# Patient Record
Sex: Male | Born: 1975 | Hispanic: No | Marital: Married | Smoking: Never smoker
Health system: Southern US, Community
[De-identification: ages and names within clinical notes are randomized; demographics above are authoritative.]

## PROBLEM LIST (undated history)

## (undated) DIAGNOSIS — K648 Other hemorrhoids: Secondary | ICD-10-CM

## (undated) DIAGNOSIS — E559 Vitamin D deficiency, unspecified: Secondary | ICD-10-CM

## (undated) DIAGNOSIS — E785 Hyperlipidemia, unspecified: Secondary | ICD-10-CM

## (undated) HISTORY — PX: NASAL SINUS SURGERY: SHX719

## (undated) HISTORY — DX: Other hemorrhoids: K64.8

## (undated) HISTORY — DX: Vitamin D deficiency, unspecified: E55.9

## (undated) HISTORY — DX: Hyperlipidemia, unspecified: E78.5

---

## 2016-03-27 ENCOUNTER — Ambulatory Visit (INDEPENDENT_AMBULATORY_CARE_PROVIDER_SITE_OTHER): Payer: Managed Care, Other (non HMO) | Admitting: Family Medicine

## 2016-03-27 ENCOUNTER — Encounter: Payer: Self-pay | Admitting: Family Medicine

## 2016-03-27 VITALS — BP 114/76 | HR 73 | Temp 97.7°F | Ht 70.0 in | Wt 188.8 lb

## 2016-03-27 DIAGNOSIS — M545 Low back pain: Secondary | ICD-10-CM

## 2016-03-27 DIAGNOSIS — E786 Lipoprotein deficiency: Secondary | ICD-10-CM | POA: Diagnosis not present

## 2016-03-27 DIAGNOSIS — K648 Other hemorrhoids: Secondary | ICD-10-CM | POA: Diagnosis not present

## 2016-03-27 DIAGNOSIS — K760 Fatty (change of) liver, not elsewhere classified: Secondary | ICD-10-CM | POA: Insufficient documentation

## 2016-03-27 DIAGNOSIS — E785 Hyperlipidemia, unspecified: Secondary | ICD-10-CM

## 2016-03-27 DIAGNOSIS — Z6827 Body mass index (BMI) 27.0-27.9, adult: Secondary | ICD-10-CM

## 2016-03-27 DIAGNOSIS — M722 Plantar fascial fibromatosis: Secondary | ICD-10-CM | POA: Diagnosis not present

## 2016-03-27 DIAGNOSIS — E559 Vitamin D deficiency, unspecified: Secondary | ICD-10-CM | POA: Diagnosis not present

## 2016-03-27 DIAGNOSIS — Z0000 Encounter for general adult medical examination without abnormal findings: Secondary | ICD-10-CM

## 2016-03-27 DIAGNOSIS — M76891 Other specified enthesopathies of right lower limb, excluding foot: Secondary | ICD-10-CM | POA: Diagnosis not present

## 2016-03-27 DIAGNOSIS — Z23 Encounter for immunization: Secondary | ICD-10-CM

## 2016-03-27 DIAGNOSIS — M5136 Other intervertebral disc degeneration, lumbar region: Secondary | ICD-10-CM

## 2016-03-27 DIAGNOSIS — Z13220 Encounter for screening for lipoid disorders: Secondary | ICD-10-CM

## 2016-03-27 LAB — CBC WITH DIFFERENTIAL/PLATELET
Basophils Absolute: 0 10*3/uL (ref 0.0–0.1)
Basophils Relative: 0.6 % (ref 0.0–3.0)
Eosinophils Absolute: 0.2 10*3/uL (ref 0.0–0.7)
Eosinophils Relative: 4.6 % (ref 0.0–5.0)
HCT: 44.1 % (ref 39.0–52.0)
Hemoglobin: 15.2 g/dL (ref 13.0–17.0)
Lymphocytes Relative: 34.9 % (ref 12.0–46.0)
Lymphs Abs: 1.6 10*3/uL (ref 0.7–4.0)
MCHC: 34.4 g/dL (ref 30.0–36.0)
MCV: 89.3 fl (ref 78.0–100.0)
Monocytes Absolute: 0.4 10*3/uL (ref 0.1–1.0)
Monocytes Relative: 9.6 % (ref 3.0–12.0)
Neutro Abs: 2.3 10*3/uL (ref 1.4–7.7)
Neutrophils Relative %: 50.3 % (ref 43.0–77.0)
Platelets: 225 10*3/uL (ref 150.0–400.0)
RBC: 4.94 Mil/uL (ref 4.22–5.81)
RDW: 13 % (ref 11.5–15.5)
WBC: 4.6 10*3/uL (ref 4.0–10.5)

## 2016-03-27 LAB — COMPREHENSIVE METABOLIC PANEL
ALT: 39 U/L (ref 0–53)
AST: 23 U/L (ref 0–37)
Albumin: 4.7 g/dL (ref 3.5–5.2)
Alkaline Phosphatase: 47 U/L (ref 39–117)
BUN: 12 mg/dL (ref 6–23)
CO2: 29 mEq/L (ref 19–32)
Calcium: 9.5 mg/dL (ref 8.4–10.5)
Chloride: 103 mEq/L (ref 96–112)
Creatinine, Ser: 0.9 mg/dL (ref 0.40–1.50)
GFR: 98.92 mL/min (ref >60.00)
Glucose, Bld: 94 mg/dL (ref 70–99)
Potassium: 4.3 mEq/L (ref 3.5–5.1)
Sodium: 138 mEq/L (ref 135–145)
Total Bilirubin: 0.7 mg/dL (ref 0.2–1.2)
Total Protein: 7.6 g/dL (ref 6.0–8.3)

## 2016-03-27 LAB — LIPID PANEL
Cholesterol: 203 mg/dL — ABNORMAL HIGH (ref 0–200)
HDL: 31 mg/dL — ABNORMAL LOW (ref >39.00)
LDL Cholesterol: 140 mg/dL — ABNORMAL HIGH (ref 0–99)
NonHDL: 172.44
Total CHOL/HDL Ratio: 7
Triglycerides: 162 mg/dL — ABNORMAL HIGH (ref 0.0–149.0)
VLDL: 32.4 mg/dL (ref 0.0–40.0)

## 2016-03-27 LAB — VITAMIN D 25 HYDROXY (VIT D DEFICIENCY, FRACTURES): VITD: 30.22 ng/mL (ref 30.00–100.00)

## 2016-03-27 NOTE — Progress Notes (Signed)
Pre visit review using our clinic review tool, if applicable. No additional management support is needed unless otherwise documented below in the visit note. 

## 2016-03-27 NOTE — Patient Instructions (Signed)
It was nice to meet you today.  Please try the provided exercises.   I am referring you to GI as we discussed.   I am referring you to PT as requested.

## 2016-03-27 NOTE — Progress Notes (Signed)
Subjective:    Thomas Cooley is a 41 y.o. male and is here for a comprehensive physical exam.   Please see subsequent visit note for further concerns today.   Chief Complaint  Patient presents with  . Establish Care    NP  . Annual Exam   Diet: Most vegetarian, also enjoys chicken.  Exercise: Runs daily, 1 mile daily.   Weight in (lb) to have BMI = 25: 173.9  No past surgical history on file.   Social History   Social History  . Marital status: Married   Social History Main Topics  . Smoking status: Never Smoker  . Smokeless tobacco: Never Used  . Alcohol use Yes     Comment: Social  . Drug use: No  . Sexual activity: Yes    Partners: Female    No family history on file.   Allergies  Allergen Reactions  . Floraquin [Iodoquinol] Rash   No outpatient prescriptions prior to visit.   Review of Systems  Constitutional: Negative for chills, fever, malaise/fatigue and weight loss.  HENT: Negative for nosebleeds and tinnitus.   Eyes: Negative for blurred vision and double vision.  Respiratory: Negative for cough and shortness of breath.   Cardiovascular: Negative for chest pain, palpitations, orthopnea and leg swelling.  Gastrointestinal: Positive for blood in stool. Negative for abdominal pain, constipation, diarrhea, heartburn, melena, nausea and vomiting.  Genitourinary: Negative for dysuria, flank pain, frequency, hematuria and urgency.  Musculoskeletal: Positive for joint pain and myalgias.  Skin: Negative for itching and rash.  Neurological: Negative for dizziness, focal weakness, weakness and headaches.  Endo/Heme/Allergies: Does not bruise/bleed easily.  Psychiatric/Behavioral: Negative for depression. The patient is not nervous/anxious and does not have insomnia.     Objective:   Vitals:   03/27/16 0751  BP: 114/76  Pulse: 73  Temp: 97.7 F (36.5 C)   Body mass index is 27.09 kg/m.  General Appearance:  Alert, cooperative, no distress,  appears stated age  Head:  Normocephalic, without obvious abnormality, atraumatic  Eyes:  PERRL, conjunctiva/corneas clear, EOM's intact, fundi benign, both eyes       Ears:  Normal TM's and external ear canals, both ears  Nose: Nares normal, septum midline, mucosa normal, no drainage    or sinus tenderness  Throat: Lips, mucosa, and tongue normal; teeth and gums normal  Neck: Supple, symmetrical, trachea midline, no adenopathy; thyroid:  No enlargement/tenderness/nodules; no carotit bruit or JVD  Back:   Symmetric, no curvature, ROM normal, no CVA tenderness  Lungs:   Clear to auscultation bilaterally, respirations unlabored  Chest wall:  No tenderness or deformity  Heart:  Regular rate and rhythm, S1 and S2 normal, no murmur, rub   or gallop  Abdomen:   Soft, non-tender, bowel sounds active all four quadrants, no masses, no organomegaly  Extremities: No cyanosis or edema  Prostate: Not done.   Skin: Skin color, texture, turgor normal, no rashes or lesions  Lymph nodes: Cervical, supraclavicular, and axillary nodes normal  Neurologic: CNII-XII grossly intact. Normal strength, sensation and reflexes throughout    Assessment/Plan:    Well Adult Exam: Labs ordered: Yes. Patient counseling was done. See below for items discussed. Discussed the patient's BMI.  The BMI is in the acceptable range Follow up next physical in 1 year or sooner for acute issues.   Patient Counseling: [x]   Nutrition: Stressed importance of moderation in sodium/caffeine intake, saturated fat and cholesterol, caloric balance, sufficient intake of fresh fruits, vegetables, and  fiber.  [x]   Stressed the importance of regular exercise.   []   Substance Abuse: Discussed cessation/primary prevention of tobacco, alcohol, or other drug use; driving or other dangerous activities under the influence; availability of treatment for abuse.   []   Sexuality: Discussed sexually transmitted diseases, partner selection, use of condoms,  avoidance of unintended pregnancy  and contraceptive alternatives. Injury prevention: Discussed safety belts, safety helmets, smoke detector, smoking near bedding or upholstery.   [x]   Dental health: Discussed importance of regular tooth brushing, flossing, and dental visits.  [x]   Health maintenance and immunizations reviewed. Please refer to Health maintenance section.    SPLIT BILLING NOTE See separate note regarding significant problems addressed in addition to the [facility] exam elements. See next page.

## 2016-03-27 NOTE — Progress Notes (Signed)
   Patient ID: Thomas KalataHarshendra Cooley, male  DOB: 04/15/1975  Age: 41 y.o. MRN: [id number]   History of Present Illness:    1. Plantar fasciitis of right foot. Heel. Worse with first step in the morning. Worsening over the past few months. Runs daily, about 1 mile. Cycles as well. No trauma. No treatment.    2. Tendonitis of knee, right > left. Posterior. Same time frame. Feels tight. Worse with extension. No treatment.   3. Bleeding internal hemorrhoids. Hx of the same. Colonoscopy a few years ago, Dx with internal hemorrhoids. Confirmed in [place]. Has been treating with herbs and watching diet. No D/C, melena. No other findings on colonoscopy per patient.     4. Vitamin D deficiency. Replaced with high dose previously. Wants checked again.    PMHx, SurgHx, SocialHx, Medications, and Allergies were reviewed in the Visit Navigator and updated as appropriate.  REVIEW OF SYSTEMS: Pertinent items noted in HPI and remainder of comprehensive ROS otherwise negative.  Physical Exam:   Vitals:   03/27/16 0751  BP: 114/76  Pulse: 73  Temp: 97.7 F (36.5 C)     Body mass index is 27.09 kg/m.  General: Alert, cooperative, appears stated age and no distress.  HEENT:  Normocephalic, without obvious abnormality, atraumatic. Conjunctivae/corneas clear. PERRL, EOM's intact. Normal TM's and external ear canals both ears. Nares normal. Septum midline. Mucosa normal. No drainage or sinus tenderness. Lips, mucosa, and tongue normal; teeth and gums normal.  Lungs: Clear to auscultation bilaterally.  Heart:: Regular rate and rhythm, S1, S2 normal, no murmur, click, rub or gallop.  Abdomen: Soft, non-tender; bowel sounds normal; no masses,  no organomegaly.  Extremities: Extremities normal, atraumatic, no cyanosis or edema.  Pulses: 2+ and symmetric.  Skin: Skin color, texture, turgor normal. No rashes or lesions.  Neurologic: Alert and oriented X 3, normal strength and tone. Normal symmetric. reflexes.  Normal coordination and gait.  Psych: Alert,oriented, in NAD with a full range of affect, normal behavior and no psychotic features  Msk:        Assessment and Plan:    Plantar fasciitis of right foot Comments: New. Reviewed diagnosis, treatment, and expectations. Handout provided. PT referral as below.   Tendonitis of knee, right Comments:              New. Reviewed diagnosis, treatment, and expectations. Handout provided. PT referral as below.  Orders: -     Ambulatory referral to Physical Therapy  Bleeding internal hemorrhoids Comments: Worsening. Hx of colonoscopy in [place]. Will request and review records. Referral to Gastroenterology. Reviewed red flags.  Orders: -     Ambulatory referral to Gastroenterology -     CBC with Differential/Platelet; Future -     Comprehensive metabolic panel; Future  Helane RimaErica Helios Kohlmann, D.O. Family Medicine [facility], [facility]

## 2016-03-28 MED ORDER — NIACIN ER 250 MG PO CPCR: 250.0000 mg | ORAL_CAPSULE | Freq: Every day | ORAL | 2 refills | Status: DC

## 2016-03-28 NOTE — Addendum Note (Signed)
Addended by: Helane RimaWALLACE, Shacarra Choe R on: 03/28/2016 03:39 PM   Modules accepted: Orders

## 2016-03-29 ENCOUNTER — Encounter: Payer: Self-pay | Admitting: Physical Therapy

## 2016-03-29 ENCOUNTER — Encounter: Payer: Self-pay | Admitting: Nurse Practitioner

## 2016-03-29 ENCOUNTER — Ambulatory Visit (INDEPENDENT_AMBULATORY_CARE_PROVIDER_SITE_OTHER): Payer: Managed Care, Other (non HMO) | Admitting: Nurse Practitioner

## 2016-03-29 ENCOUNTER — Ambulatory Visit: Payer: Managed Care, Other (non HMO) | Attending: Family Medicine | Admitting: Physical Therapy

## 2016-03-29 VITALS — BP 110/62 | HR 100 | Ht 69.29 in | Wt 193.2 lb

## 2016-03-29 DIAGNOSIS — M25561 Pain in right knee: Secondary | ICD-10-CM | POA: Insufficient documentation

## 2016-03-29 DIAGNOSIS — M6281 Muscle weakness (generalized): Secondary | ICD-10-CM | POA: Insufficient documentation

## 2016-03-29 DIAGNOSIS — M25571 Pain in right ankle and joints of right foot: Secondary | ICD-10-CM | POA: Diagnosis present

## 2016-03-29 DIAGNOSIS — K625 Hemorrhage of anus and rectum: Secondary | ICD-10-CM | POA: Diagnosis not present

## 2016-03-29 MED ORDER — HYDROCORTISONE 2.5 % RE CREA: 1.0000 "application " | TOPICAL_CREAM | Freq: Every day | RECTAL | 0 refills | Status: DC

## 2016-03-29 NOTE — Patient Instructions (Addendum)
PROM: Toe Flexion / Extension    Gently grasp right toes Do second position. Hold 30____ seconds. Repeat _3___ times per set. Do _1___ sets per session. Do 1___ sessions per day.   http://orth.exer.us/66   Copyright  VHI. All rights reserved.  Achilles / Soleus, Standing    Stand, right foot behind, heel on floor and turned slightly out. Lower hips and bend knees. Hold _30__ seconds. Repeat _3__ times per session. Do __1_ sessions per day.  Copyright  VHI. All rights reserved.  Achilles / Gastroc, Standing    Stand, right foot behind, heel on floor and turned slightly out, leg straight, forward leg bent. Move hips forward. Hold __30_ seconds. Repeat _3__ times per session. Do __1_ sessions per day.  Copyright  VHI. All rights reserved.    Foot Arch Stretch (Plantar Fascia)    Sitting on edge of chair, place foot on top of rolling pin and gently roll foot forward and backward over rolling pin. Feel stretch in arch of foot. Repeat with other foot. Repeat ____1-2 minutes using hard ball or rolling pin, or frozen water bottle.  Do __1__ sessions per day.  http://gt2.exer.us/415   Copyright  VHI. All rights reserved.  Chair Sitting    Sit at edge of seat, spine straight, one leg extended. Put a hand on each thigh and bend forward from the hip, keeping spine straight. Allow hand on extended leg to reach toward toes. Support upper body with other arm. Hold __30_ seconds. Repeat _3__ times per session. Do __1_ sessions per day.  Can also do lying on your back with strap  Copyright  VHI. All rights reserved.

## 2016-03-29 NOTE — Patient Instructions (Signed)
If you are age 41 or older, your body mass index should be between 23-30. Your Body mass index is 28.3 kg/m. If this is out of the aforementioned range listed, please consider follow up with your Primary Care Provider.  If you are age 41 or younger, your body mass index should be between 19-25. Your Body mass index is 28.3 kg/m. If this is out of the aformentioned range listed, please consider follow up with your Primary Care Provider.   We have sent the following medications to your pharmacy for you to pick up at your convenience:  Anusol Cream  You have been given a hand out on hemorrhoid banding. Please contact our office to schedule an appoint with Dr Lavon PaganiniNandigam if you wish to have procedure.  Thank you

## 2016-03-29 NOTE — Progress Notes (Signed)
HPI:  Patient is a 41 year old male referred by PCP Helane RimaErica Wallace, D.O., for evaluation of rectal bleeding. He has had intermittent painless rectal bleeding since 2014.  Blood is bright red, low volume. Episodes occur every few months and last about 3 days. In 2016 patient had [place] in [place] Apparently the prep was poor but found to have internal hemorrhoids.  The same year he what sounds like an anoscopy in [place] with same findings. He was offered banding but decided to treat himself with herbal medication instead. He has continued to successfully treat the hemorrhoids with herbal medication as needed. Patient has no abdominal pain. His stools are solid, sometimes hard.       Past Medical History:  Diagnosis Date  . Hyperlipidemia   . Internal hemorrhoid, bleeding   . Vitamin D deficiency     Past Surgical History:  Procedure Laterality Date  . NASAL SINUS SURGERY     Family History  Problem Relation Age of Onset  . Diabetes Mother   . Anal fissures Mother   . Diabetes Father   . Kidney failure Father   . Anal fissures Father    Social History  Substance Use Topics  . Smoking status: Never Smoker  . Smokeless tobacco: Never Used  . Alcohol use Yes     Comment: Social    Current Outpatient Prescriptions  Medication Sig Dispense Refill  . niacin 250 MG CR capsule Take 1 capsule (250 mg total) by mouth at bedtime. 30 capsule 2  . Vitamin D, Ergocalciferol, (DRISDOL) 50000 units CAPS capsule Take 50,000 Units by mouth every 7 (seven) days.     No current facility-administered medications for this visit.    Allergies  Allergen Reactions  . Floraquin [Iodoquinol] Rash    Review of Systems: Positive for headaches, itching, muscle pain . All other systems reviewed and negative except where noted in HPI.    Physical Exam: BP 110/62 (BP Location: Left Arm, Patient Position: Sitting, Cuff Size: Normal)   Pulse 100   Ht 5' 9.29" (1.76 m) Comment:  height measured without shoes  Wt 193 lb 4 oz (87.7 kg)   BMI 28.30 kg/m  Constitutional:  Well-developed, male in no acute distress. Psychiatric: Normal mood and affect. Behavior is normal. HEENT: Normocephalic and atraumatic. Conjunctivae are normal. No scleral icterus. Neck supple.  Cardiovascular: Normal rate, regular rhythm.  Pulmonary/chest: Effort normal and breath sounds normal. No wheezing, rales or rhonchi. Abdominal: Soft, nondistended, nontender. Bowel sounds active throughout. There are no masses palpable. No hepatomegaly. Rectal: no external lesions. No blood nor stool in vault. He was tense on exam but I didn't appreciate any rectal masses.  Extremities: no edema Lymphadenopathy: No cervical adenopathy noted. Neurological: Alert and oriented to person place and time. Skin: Skin is warm and dry. No rashes noted.   ASSESSMENT AND PLAN:   Pleasant 41 yo male with 3-4 year history of episodic painless rectal bleeding with bowel movements. Diagnosed with internal hemorrhoids in 2016. He takes an herbal medication as needed for the hemorrhoidal bleeding.  -We discussed treatment options. He is interested in trial of hydrocortisone cream. Patient takes an herbal medication as needed for hemorrhoids, he might choose to continue this instead.  -We discussed hemorrhoidal banding but he first needs a complete colonoscopy to rule out other etiologies of bleeding. It sounds like the colonoscopy in [place]was limited by poor prep. Records requested and I'll call him when received.  I gave  him banding brochure to read in the interim.    Willette Cluster, NP  03/29/2016, 2:24 PM  Cc: Helane Rima, DO

## 2016-03-30 ENCOUNTER — Ambulatory Visit: Payer: Managed Care, Other (non HMO) | Admitting: Physical Therapy

## 2016-03-30 DIAGNOSIS — M25571 Pain in right ankle and joints of right foot: Secondary | ICD-10-CM | POA: Diagnosis not present

## 2016-03-30 DIAGNOSIS — M6281 Muscle weakness (generalized): Secondary | ICD-10-CM

## 2016-03-30 DIAGNOSIS — M25561 Pain in right knee: Secondary | ICD-10-CM

## 2016-03-30 NOTE — Patient Instructions (Signed)
     Trigger Point Dry Needling  . What is Trigger Point Dry Needling (DN)? o DN is a physical therapy technique used to treat muscle pain and dysfunction. Specifically, DN helps deactivate muscle trigger points (muscle knots).  o A thin filiform needle is used to penetrate the skin and stimulate the underlying trigger point. The goal is for a local twitch response (LTR) to occur and for the trigger point to relax. No medication of any kind is injected during the procedure.   . What Does Trigger Point Dry Needling Feel Like?  o The procedure feels different for each individual patient. Some patients report that they do not actually feel the needle enter the skin and overall the process is not painful. Very mild bleeding may occur. However, many patients feel a deep cramping in the muscle in which the needle was inserted. This is the local twitch response.   . How Will I feel after the treatment? o Soreness is normal, and the onset of soreness may not occur for a few hours. Typically this soreness does not last longer than two days.  o Bruising is uncommon, however; ice can be used to decrease any possible bruising.  o In rare cases feeling tired or nauseous after the treatment is normal. In addition, your symptoms may get worse before they get better, this period will typically not last longer than 24 hours.   . What Can I do After My Treatment? o Increase your hydration by drinking more water for the next 24 hours. o You may place ice or heat on the areas treated that have become sore, however, do not use heat on inflamed or bruised areas. Heat often brings more relief post needling. o You can continue your regular activities, but vigorous activity is not recommended initially after the treatment for 24 hours. o DN is best combined with other physical therapy such as strengthening, stretching, and other therapies.    Stacy Simpson PT [facility] [address], [address] [place], [place] [address] Phone # [phone] Fax [phone] 

## 2016-03-30 NOTE — Therapy (Signed)
[facility] [address], [address] [place], [place], [address] Phone: [phone]   Fax:  [phone]  Physical Therapy Treatment  Patient Details  Name: Thomas Cooley MRN: [id number] Date of Birth: 02/23/1975 Referring Provider: Helane RimaErica Wallace, DO  Encounter Date: 03/30/2016      PT End of Session - 03/30/16 1716    Visit Number 2   Date for PT Re-Evaluation 05/24/16   PT Start Time 1530   PT Stop Time 1620   PT Time Calculation (min) 50 min   Activity Tolerance Patient tolerated treatment well      Past Medical History:  Diagnosis Date  . Hyperlipidemia   . Internal hemorrhoid, bleeding   . Vitamin D deficiency     Past Surgical History:  Procedure Laterality Date  . NASAL SINUS SURGERY      There were no vitals filed for this visit.      Subjective Assessment - 03/30/16 1533    Subjective (P)  No pain at present.  Last had morning pain in heel.  Knee pain with sitting with knee bent prolonged positions or criss cross positions.  Went to the gym yesterday and did fast walking on treadmill.     Currently in Pain? (P)  No/denies   Pain Score (P)  0-No pain                        [facility] Adult PT Treatment/Exercise - 03/30/16 0001      Knee/Hip Exercises: Stretches   Active Hamstring Stretch --  supine with strap and sitting   Gastroc Stretch --  standing   Soleus Stretch --  standing   Other Knee/Hip Stretches --      Doorway stretch with and without UE 3x 5 right and left.   Manual therapy Graston technique G4 to bilateral HS and gastroc/soleus  Moist heat bilaterally 10 min in prone.      Trigger Point Dry Needling - 03/30/16 1715    Consent Given? Yes   Education Handout Provided Yes   Muscles Treated Lower Body Hamstring;Gastrocnemius   Hamstring Response Palpable increased muscle length   Gastrocnemius Response Twitch response elicited;Palpable increased muscle length               PT Education - 03/30/16 0739    Education provided Yes   Education Details gastroc/soleus stretch, hamstring stretch, rolling on ball, plantar fascia stretch   Person(s) Educated Patient   Methods Explanation;Demonstration;Verbal cues;Handout   Comprehension Verbalized understanding;Returned demonstration          PT Short Term Goals - 03/30/16 1721      PT SHORT TERM GOAL #1   Title independent with initial HEP   Time 4   Period Weeks   Status On-going     PT SHORT TERM GOAL #2   Title reports able to sit on floor for 15 minutes before feeling increased pain   Time 4   Period Weeks   Status On-going           PT Long Term Goals - 03/30/16 1721      PT LONG TERM GOAL #1   Title FOTO < or = to 40%   Time 8   Period Weeks   Status On-going     PT LONG TERM GOAL #2   Title independent with advanced HEP   Time 8   Period Weeks   Status On-going     PT  LONG TERM GOAL #3   Title reports 75% reduced pain in the morning and when sitting on the floor   Period Weeks   Status On-going     PT LONG TERM GOAL #4   Title AROM ankle dorsiflexion equal bilaterally due to improved muslce length and to reduce pain during functional activities.   Time 8   Period Weeks   Status On-going               Plan - 03/30/16 1717    Clinical Impression Statement The patient reports no pain at present.  Has pain in popliteal fossa area with sitting on his knees or criss crossed.  He has bilateral heel pain especially in the mornings.  Tender points in gastrocs.  Reviewed HEP from yesterday and stressed the importance for best outcomes.  Therapist closely monitoring response with all interventions.     PT Next Visit Plan assess response to DN on right and continue if beneficial;  Graston technique to plantar fascia;  add hip strengthening to HEP      Patient will benefit from skilled therapeutic intervention in order to improve the following deficits and  impairments:     Visit Diagnosis: Pain in right ankle and joints of right foot  Acute pain of right knee  Muscle weakness (generalized)     Problem List Patient Active Problem List   Diagnosis Date Noted  . Plantar fasciitis of right foot 03/27/2016  . Vitamin D deficiency 03/27/2016  . Bleeding internal hemorrhoids 03/27/2016  . Tendonitis of knee, right 03/27/2016  . BMI 27.0-27.9,adult 03/27/2016  . 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 NAFL (nonalcoholic fatty liver) 03/27/2016    Lavinia Sharps, PT 03/30/16 5:24 PM Phone: [phone] Fax: [phone]  Vivien Presto 03/30/2016, Alfonse Flavors PM  [facility] [address] [place], [place], [address] Phone: [phone]   Fax:  [phone]  Name: Thomas Cooley MRN: [id number] Date of Birth: March 27, 1975

## 2016-03-30 NOTE — Therapy (Signed)
[facility] [address], [address] [place], [place], [address] Phone: [phone]   Fax:  [phone]  Physical Therapy Evaluation  Patient Details  Name: Thomas Cooley MRN: [id number] Date of Birth: [birth year] Referring Provider: Helane Rima, DO  Encounter Date: 03/29/2016      PT End of Session - 03/29/16 1155    Visit Number 1   Date for PT Re-Evaluation 05/24/16   PT Start Time 0845   PT Stop Time 0932   PT Time Calculation (min) 47 min   Activity Tolerance Patient tolerated treatment well   Behavior During Therapy [facility] for tasks assessed/performed      Past Medical History:  Diagnosis Date  . Hyperlipidemia   . Internal hemorrhoid, bleeding   . Vitamin D deficiency     Past Surgical History:  Procedure Laterality Date  . NASAL SINUS SURGERY      There were no vitals filed for this visit.       Subjective Assessment - 03/29/16 0859    Subjective Unable to sit on the floor as much as normal because knee starts hurting.  Reports started wearing new shoes about the time his heels began hurting.   Limitations Sitting   How long can you sit comfortably? 3-4 minutes   Currently in Pain? Yes   Pain Score 7    Pain Location Knee   Pain Orientation Left;Right;Mid  left>right   Pain Descriptors / Indicators Burning   Pain Onset More than a month ago   Pain Frequency Intermittent   Aggravating Factors  sitting on the floor   Pain Relieving Factors not sitting on the floor, takes 10-15 minutes to feel better   Effect of Pain on Daily Activities can't do things on the floor   Multiple Pain Sites Yes   Pain Score 8   Pain Location Heel   Pain Orientation Left;Right  Left > right   Pain Descriptors / Indicators Burning   Pain Type Acute pain   Pain Onset More than a month ago   Pain Frequency Intermittent   Aggravating Factors  walking on hard floor   Pain Relieving Factors walking for a little while     Effect of Pain on Daily Activities pain when walking in the morning            [facility] PT Assessment - 03/29/16 0001      Assessment   Medical Diagnosis M76.891 tendonitis of knee, right; Z23 encounter of immunization   Referring Provider Helane Rima, DO   Onset Date/Surgical Date 08/27/15  going on 7-8 month for knee, heel 3-4 months   Prior Therapy no     Precautions   Precautions None     Restrictions   Weight Bearing Restrictions No     Balance Screen   Has the patient fallen in the past 6 months No     Home Environment   Living Environment Private residence   Living Arrangements Spouse/significant other;Children   Home Layout One level     Prior Function   Level of Independence Independent   Vocation Full time employment   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   Leisure exercises in evenings - fast walking on treadmill and stationary bike at the gym     Cognition   Overall Cognitive Status Within Functional Limits for tasks assessed     Observation/Other Assessments   Focus on Therapeutic Outcomes (FOTO)  65% limited  40% limted - goal  Posture/Postural Control   Posture/Postural Control No significant limitations     AROM   Right Ankle Dorsiflexion 9   Left Ankle Dorsiflexion 15     Strength   Overall Strength Comments LE grossly 5/5 bilateral but Right slightly stronger than Left   Right Hip ABduction 4+/5   Left Hip ABduction 5/5     Palpation   Palpation comment Right > Left muscle spasms and trigger points along hamstrings, gastroc and soleus, point tenderness at bilateral plantar fascia and insertion on heels     Ambulation/Gait   Gait Pattern Within Functional Limits                   [facility] Adult PT Treatment/Exercise - 03/29/16 0001      Knee/Hip Exercises: Stretches   Active Hamstring Stretch 2 reps;30 seconds  supine with strap and sitting   Gastroc Stretch Right;2 reps;30 seconds  standing   Soleus Stretch  Right;2 reps;30 seconds  standing   Other Knee/Hip Stretches rolling ball on foot                PT Education - 03/30/16 0739    Education provided Yes   Education Details gastroc/soleus stretch, hamstring stretch, rolling on ball, plantar fascia stretch   Person(s) Educated Patient   Methods Explanation;Demonstration;Verbal cues;Handout   Comprehension Verbalized understanding;Returned demonstration          PT Short Term Goals - 03/30/16 0754      PT SHORT TERM GOAL #1   Title independent with initial HEP   Time 4   Period Weeks   Status New     PT SHORT TERM GOAL #2   Title reports able to sit on floor for 15 minutes before feeling increased pain   Time 4   Period Weeks   Status New           PT Long Term Goals - 03/30/16 0755      PT LONG TERM GOAL #1   Title FOTO < or = to 40%   Time 8   Period Weeks   Status New     PT LONG TERM GOAL #2   Title independent with advanced HEP   Time 8   Period Weeks   Status New     PT LONG TERM GOAL #3   Title reports 75% reduced pain in the morning and when sitting on the floor   Time 8   Period Weeks   Status New     PT LONG TERM GOAL #4   Title AROM ankle dorsiflexion equal bilaterally due to improved muslce length and to reduce pain during functional activities.   Time 8   Period Weeks   Status New               Plan - 03/30/16 [id number]    Clinical Impression Statement Pt presents for low complexity eval due to no comorbidities effecting treatment.  Pt presents with muscle spasms in hamstring and gastroc/solues Rt>Lt.  Mild weakness of Rt hip abduction and extension 4+/5.  Pt is has pain in both knees and heels up to 8/10 Rt> than Lt.  Pt presents with plantarfasciitis with tenderness at the heel that is worse in the morning.  Pt will benefit from skilled PT for manual techniques and dry needling to assess soft tissue and other PT treatments to address muscle imbalances and educate patient on  appriopriate HEP.   Rehab Potential Excellent   Clinical Impairments Affecting  Rehab Potential none   PT Frequency 2x / week   PT Duration 8 weeks   PT Treatment/Interventions Cryotherapy;Electrical Stimulation;Iontophoresis 4mg /ml Dexamethasone;Moist Heat;Dry needling;Manual techniques;Therapeutic activities;Therapeutic exercise;Neuromuscular re-education;ADLs/Self Care Home Management   PT Next Visit Plan assess knee mobility, dry needling as indicated, LE stretches and hip/glute strengthening   PT Home Exercise Plan progress as needed   Recommended Other Services none   Consulted and Agree with Plan of Care Patient      Patient will benefit from skilled therapeutic intervention in order to improve the following deficits and impairments:  Decreased range of motion, Increased muscle spasms, Pain, Decreased strength  Visit Diagnosis: Pain in right ankle and joints of right foot  Acute pain of right knee  Muscle weakness (generalized)     Problem List Patient Active Problem List   Diagnosis Date Noted  . Plantar fasciitis of right foot 03/27/2016  . Vitamin D deficiency 03/27/2016  . Bleeding internal hemorrhoids 03/27/2016  . Tendonitis of knee, right 03/27/2016  . BMI 27.0-27.9,adult 03/27/2016  . 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 NAFL (nonalcoholic fatty liver) 03/27/2016    Vincente Poli, PT 03/30/2016, 8:00 AM  [facility] [address] [place], [place], [address] Phone: [phone]   Fax:  [phone]  Name: Thomas Cooley MRN: [id number] Date of Birth: 02-24-1975

## 2016-04-03 NOTE — Progress Notes (Signed)
Reviewed and agree with documentation and assessment and plan. K. Veena Nandigam , MD   

## 2016-04-04 ENCOUNTER — Encounter: Payer: Managed Care, Other (non HMO) | Admitting: Physical Therapy

## 2016-04-07 ENCOUNTER — Ambulatory Visit: Payer: Managed Care, Other (non HMO) | Admitting: Physical Therapy

## 2016-04-07 DIAGNOSIS — M6281 Muscle weakness (generalized): Secondary | ICD-10-CM

## 2016-04-07 DIAGNOSIS — M25561 Pain in right knee: Secondary | ICD-10-CM

## 2016-04-07 DIAGNOSIS — M25571 Pain in right ankle and joints of right foot: Secondary | ICD-10-CM | POA: Diagnosis not present

## 2016-04-07 NOTE — Therapy (Signed)
[facility] [address], [address] [place], [place], [address] Phone: [phone]   Fax:  [phone]  Physical Therapy Treatment  Patient Details  Name: Thomas KalataHarshendra Theroux MRN: [id number] Date of Birth: Apr 26, 1975 Referring Provider: Helane RimaErica Wallace, DO  Encounter Date: 04/07/2016      PT End of Session - 04/07/16 0854    Visit Number 3   Date for PT Re-Evaluation 05/24/16   PT Start Time 0802   PT Stop Time 0843   PT Time Calculation (min) 41 min   Activity Tolerance Patient tolerated treatment well   Behavior [facility] Therapy [facility] for tasks assessed/performed      Past Medical History:  Diagnosis Date  . Hyperlipidemia   . Internal hemorrhoid, bleeding   . Vitamin D deficiency     Past Surgical History:  Procedure Laterality Date  . NASAL SINUS SURGERY      There were no vitals filed for this visit.      Subjective Assessment - 04/07/16 0806    Subjective States he did calf raises and his heel pain got worse.  Knee is still hurting when sitting criss cross after about 3 minutes, can't eat dinner   Patient Stated Goals eat dinner sitting on the floor   Currently in Pain? Yes   Pain Score 5    Pain Location Heel   Pain Orientation Right;Left   Pain Descriptors / Indicators Burning   Pain Onset More than a month ago   Aggravating Factors  calf raises                         [facility] Adult PT Treatment/Exercise - 04/07/16 0001      Self-Care   Self-Care ADL's   ADL's education on sitting on pillow on the floor for less stress on knee     Knee/Hip Exercises: Stretches   Active Hamstring Stretch Right;Left;30 seconds  standing   Other Knee/Hip Stretches foam rolling IT band     Knee/Hip Exercises: Machines for Strengthening   Total Gym Leg Press single leg 50# 2x10, 70# x10 both legs     Knee/Hip Exercises: Sidelying   Hip ABduction Strengthening;Right;Left;20 reps     Knee/Hip Exercises:  Prone   Other Prone Exercises knee planks 3x30sec     Manual Therapy   Manual Therapy Soft tissue mobilization   Soft tissue mobilization Rt hamstring, IT band                PT Education - 04/07/16 0849    Education provided Yes   Education Details abduction, leg press, knee planks   Person(s) Educated Patient   Methods Explanation;Demonstration;Tactile cues;Verbal cues;Handout   Comprehension Verbalized understanding;Returned demonstration          PT Short Term Goals - 03/30/16 1721      PT SHORT TERM GOAL #1   Title independent with initial HEP   Time 4   Period Weeks   Status On-going     PT SHORT TERM GOAL #2   Title reports able to sit on floor for 15 minutes before feeling increased pain   Time 4   Period Weeks   Status On-going           PT Long Term Goals - 03/30/16 1721      PT LONG TERM GOAL #1   Title FOTO < or = to 40%   Time 8   Period Weeks   Status On-going  PT LONG TERM GOAL #2   Title independent with advanced HEP   Time 8   Period Weeks   Status On-going     PT LONG TERM GOAL #3   Title reports 75% reduced pain in the morning and when sitting on the floor   Period Weeks   Status On-going     PT LONG TERM GOAL #4   Title AROM ankle dorsiflexion equal bilaterally due to improved muslce length and to reduce pain during functional activities.   Time 8   Period Weeks   Status On-going               Plan - 04/07/16 0857    Clinical Impression Statement Patient posterior chain is very tight and demonstrates core weakness.   Fatigues quickly with knee planks and needed cues for good body mechanics.  Skilled PT needed for strengthening muscle imbalances.   Rehab Potential Excellent   Clinical Impairments Affecting Rehab Potential none   PT Frequency 2x / week   PT Duration 8 weeks   PT Treatment/Interventions Cryotherapy;Electrical Stimulation;Iontophoresis 4mg /ml Dexamethasone;Moist Heat;Dry needling;Manual  techniques;Therapeutic activities;Therapeutic exercise;Neuromuscular re-education;ADLs/Self Care Home Management   PT Next Visit Plan add ankle dorsiflexion, continue core and       Patient will benefit from skilled therapeutic intervention in order to improve the following deficits and impairments:  Decreased range of motion, Increased muscle spasms, Pain, Decreased strength  Visit Diagnosis: Pain in right ankle and joints of right foot  Acute pain of right knee  Muscle weakness (generalized)     Problem List Patient Active Problem List   Diagnosis Date Noted  . Plantar fasciitis of right foot 03/27/2016  . Vitamin D deficiency 03/27/2016  . Bleeding internal hemorrhoids 03/27/2016  . Tendonitis of knee, right 03/27/2016  . BMI 27.0-27.9,adult 03/27/2016  . 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 NAFL (nonalcoholic fatty liver) 03/27/2016    Vincente Poli , PT 04/07/2016, 11:17 AM  [facility] [address], [address] [place], [place], [address] Phone: [phone]   Fax:  [phone]  Name: Rober Skeels MRN: [id number] Date of Birth: 1975-07-31

## 2016-04-07 NOTE — Patient Instructions (Signed)
Abduction Lift    Lie on residual limb side. Tighten muscles on outside of hip to raise sound limb ____ inches. Hold ____ seconds. Repeat ____ times. Do ____ sessions per day.  Copyright  VHI. All rights reserved.    Planks on knees: 3 x 30sec   Leg press: single leg 3 sets of 10 reps

## 2016-04-10 ENCOUNTER — Telehealth: Payer: Self-pay | Admitting: Nurse Practitioner

## 2016-04-10 NOTE — Telephone Encounter (Signed)
No new symptoms. He has blood with each bowel movement. No pain with bowel movement or other time. Using stool softeners and denies constipation. He is interested in getting the banding procedure done as soon as possible. He is using the Hydrocortisone 2.5% cream as directed. He is very adamant and polite about not wanting to redo his colonoscopy at this time. He says he had the colonoscopy due to abdominal pain which has fully resolved. He was told at that time and then again last year that he has internal hemorrhoids. [place] from [place]. Please advise.

## 2016-04-11 NOTE — Telephone Encounter (Signed)
I think we requested the colonoscopy report to review. Will need to review it prior to scheduling for hemorrhoidal banding to exclude etiologies other than internal hemorrhoids causing blood per rectum

## 2016-04-12 ENCOUNTER — Ambulatory Visit: Payer: Managed Care, Other (non HMO)

## 2016-04-12 DIAGNOSIS — M25561 Pain in right knee: Secondary | ICD-10-CM

## 2016-04-12 DIAGNOSIS — M25571 Pain in right ankle and joints of right foot: Secondary | ICD-10-CM

## 2016-04-12 DIAGNOSIS — M6281 Muscle weakness (generalized): Secondary | ICD-10-CM

## 2016-04-12 NOTE — Therapy (Addendum)
[facility] [address] [place], [place], [address] Phone: [phone]   Fax:  [phone]  Physical Therapy Treatment  Patient Details  Name: Thomas Cooley MRN: [id number] Date of Birth: [birth year] Referring Provider: Briscoe Deutscher, DO  Encounter Date: 04/12/2016      PT End of Session - 04/12/16 1612    Visit Number 4   Date for PT Re-Evaluation 05/24/16   PT Start Time 6962   PT Stop Time 1612   PT Time Calculation (min) 41 min   Activity Tolerance Patient tolerated treatment well   Behavior During Therapy [facility] for tasks assessed/performed      Past Medical History:  Diagnosis Date  . Hyperlipidemia   . Internal hemorrhoid, bleeding   . Vitamin D deficiency     Past Surgical History:  Procedure Laterality Date  . NASAL SINUS SURGERY      There were no vitals filed for this visit.                       [facility] Adult PT Treatment/Exercise - 04/12/16 0001      Knee/Hip Exercises: Stretches   Gastroc Stretch 5 reps;10 seconds  using rocker board on wall     Knee/Hip Exercises: Aerobic   Stationary Bike Level 2x 8 minutes  PT present to discuss progress     Knee/Hip Exercises: Standing   Rocker Board 3 minutes     Manual Therapy   Manual Therapy Soft tissue mobilization   Soft tissue mobilization Rt & Lt hamstring and gastroc          Trigger Point Dry Needling - 04/12/16 1538    Consent Given? Yes   Muscles Treated Lower Body Hamstring;Gastrocnemius   Hamstring Response Twitch response elicited;Palpable increased muscle length   Gastrocnemius Response Twitch response elicited;Palpable increased muscle length                PT Short Term Goals - 04/12/16 1539      PT SHORT TERM GOAL #1   Title independent with initial HEP   Status Achieved     PT SHORT TERM GOAL #2   Title reports able to sit on floor for 15 minutes before feeling increased pain   Time 4    Period Weeks   Status On-going           PT Long Term Goals - 04/12/16 1539      PT LONG TERM GOAL #2   Title independent with advanced HEP   Time 8   Period Weeks   Status On-going     PT LONG TERM GOAL #3   Title reports 75% reduced pain in the morning and when sitting on the floor   Time 8   Period Weeks               Plan - 04/12/16 1540    Clinical Impression Statement Pt with tension and trigger points in Rt hamstrings and gastroc.  Pt demonstrated improved tissue mobility after dry needling today.  Pt with tightness in the Rt LE and fatigues quickly with exercise.  Pt without significant change in symptoms since the start of care.  Pt will continue to benefit from skilled PT for Rt LE flexibility and strength and manual as needed.     Rehab Potential Excellent   PT Frequency 2x / week   PT Duration 8 weeks   PT Treatment/Interventions Cryotherapy;Electrical Stimulation;Iontophoresis 48m/ml Dexamethasone;Moist Heat;Dry  needling;Manual techniques;Therapeutic activities;Therapeutic exercise;Neuromuscular re-education;ADLs/Self Care Home Management   PT Next Visit Plan assess response to dry needling, LE strength and flexibility, core   Consulted and Agree with Plan of Care Patient      Patient will benefit from skilled therapeutic intervention in order to improve the following deficits and impairments:  Decreased range of motion, Increased muscle spasms, Pain, Decreased strength  Visit Diagnosis: Pain in right ankle and joints of right foot  Acute pain of right knee  Muscle weakness (generalized)     Problem List Patient Active Problem List   Diagnosis Date Noted  . Plantar fasciitis of right foot 03/27/2016  . Vitamin D deficiency 03/27/2016  . Bleeding internal hemorrhoids 03/27/2016  . Tendonitis of knee, right 03/27/2016  . BMI 27.0-27.9,adult 03/27/2016  . 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 NAFL (nonalcoholic fatty liver) 03/27/2016      [name], PT 04/12/16 4:15 PM PHYSICAL THERAPY DISCHARGE SUMMARY  Visits from Start of Care: 4  Current functional level related to goals / functional outcomes: Pt attended 4 PT sessions and canceled all remaining appointments.     Remaining deficits: See above for current status.     Education / Equipment: HEP Plan: Patient agrees to discharge.  Patient goals were not met. Patient is being discharged due to not returning since the last visit.  ?????  [name], PT 05/17/16 12:50 PM       [facility] [address], [address] [place], [place], [address] Phone: [phone]   Fax:  [phone]  Name: Thomas Cooley MRN: [id number] Date of Birth: 08/08/1975   

## 2016-04-13 NOTE — Telephone Encounter (Signed)
Will await the records.

## 2016-04-17 NOTE — Telephone Encounter (Signed)
Thomas Cooley when the records come in please share with Dr Lavon PaganiniNandigam.

## 2016-04-18 ENCOUNTER — Ambulatory Visit: Payer: Managed Care, Other (non HMO) | Admitting: Physical Therapy

## 2016-04-19 NOTE — Telephone Encounter (Signed)
I will be going to the office later today and I'll check to see if they are there. Thanks

## 2016-05-05 ENCOUNTER — Telehealth: Payer: Self-pay

## 2016-05-05 NOTE — Telephone Encounter (Signed)
-----   Message from Meredith PelPaula M Guenther, NP sent at 04/28/2016  1:14 PM EST ----- Waynetta SandyBeth, I was at office recently and still no colonoscopy records. Can you check on this? I realize he doesn't want a repeat colonoscopy but  I am doubtful that Dr. Lavon PaganiniNandigam would band him without excluding other etiologies of bleeding first.  Thanks  ----- Message ----- From: Evalee JeffersonElizabeth A McKew, LPN Sent: 1/61/09602/22/2018   9:45 AM To: Meredith PelPaula M Guenther, NP  Have you seen his colonoscopy records?

## 2016-12-22 ENCOUNTER — Ambulatory Visit (INDEPENDENT_AMBULATORY_CARE_PROVIDER_SITE_OTHER): Payer: Managed Care, Other (non HMO) | Admitting: Family Medicine

## 2016-12-22 ENCOUNTER — Encounter: Payer: Self-pay | Admitting: Family Medicine

## 2016-12-22 VITALS — BP 100/70 | HR 86 | Ht 69.25 in | Wt 191.8 lb

## 2016-12-22 DIAGNOSIS — Z23 Encounter for immunization: Secondary | ICD-10-CM

## 2016-12-22 DIAGNOSIS — M79602 Pain in left arm: Secondary | ICD-10-CM | POA: Diagnosis not present

## 2016-12-22 MED ORDER — TRAMADOL HCL 50 MG PO TABS: 50.0000 mg | ORAL_TABLET | Freq: Four times a day (QID) | ORAL | 0 refills | Status: DC | PRN

## 2016-12-22 MED ORDER — MELOXICAM 15 MG PO TABS: 15.0000 mg | ORAL_TABLET | Freq: Every day | ORAL | 0 refills | Status: DC

## 2016-12-22 MED ORDER — ACETAMINOPHEN 500 MG PO TABS: 1000.0000 mg | ORAL_TABLET | Freq: Three times a day (TID) | ORAL | 0 refills | Status: DC | PRN

## 2016-12-22 NOTE — Progress Notes (Signed)
    Subjective:  Thomas Cooley is a 41 y.o. male who presents today with a chief complaint of left arm pain.   HPI:  Left arm pain, acute issue Started about 3 weeks ago.  The pain is worsened over that time.  No injury or obvious precipitating events.  However patient does note that he was doing some curls and exercises may be a week or 2 prior to the arm pain starting.  He tried taking Tylenol which helped a little bit.  He also had some leftover tramadol at home which is helped some with the pain.  Pain is mostly located in his biceps those sometimes in his left elbow and in his shoulder.  No fevers or chills.  Pain is described as a sharp sensation.  Is also occasionally getting some numbness in his hands.  ROS: Per HPI  PMH: Smoking history reviewed.  Never smoker.  Objective:  Physical Exam: BP 100/70   Pulse 86   Ht 5' 9.25" (1.759 m)   Wt 191 lb 12.8 oz (87 kg)   SpO2 98%   BMI 28.12 kg/m   Gen: NAD, resting comfortably MSK:  -Neck: No deformities.  Full range of motion.  Nontender to palpation.  Spurling negative bilaterally. -Left shoulder: No deformities.  Full range of motion.  Supraspinatus testing intact.  Neer and Hawking test negative.  Normal strength with external and internal rotation.  Biceps tendon nontender. -Left elbow: No deformities.  Full range of motion.  Mild tenderness along distal insertion of biceps.  Tinel sign negative at the ulnar tunnel.  Speeds test negative. -Left wrist: No deformities.  Full range of motion.  Phalen and Tinel signs negative. -Left hand: No deformities.  Normal grip strength.  Distal pulses intact.  Assessment/Plan:  Left arm pain Unclear etiology.  His physical exam is basically completely normal.  He does have slight tenderness at the distal insertion of his biceps, however has normal strength with forearm flexion and his speeds test is negative.  Likely has mild irritation in his biceps.  Start meloxicam 15 mg daily for 2  weeks.  Patient also requested something slightly stronger for pain-small supply of tramadol was given.  Advised patient to avoid strenuous activities that aggravate the pain.  Return precautions reviewed.  Follow-up as needed.  Preventative healthcare Flu shot given today.  Katina Degreealeb M. Jimmey RalphParker, MD 12/22/2016 11:24 AM

## 2016-12-22 NOTE — Patient Instructions (Signed)
Start the meloxicam. You can also use tylenol and tramadol as needed.  Come back if not improving in 2 weeks.  Take care,  Dr Jimmey RalphParker

## 2017-03-26 ENCOUNTER — Telehealth: Payer: Self-pay | Admitting: *Deleted

## 2017-03-26 NOTE — Telephone Encounter (Signed)
Ok with me 

## 2017-03-26 NOTE — Telephone Encounter (Signed)
Copied from CRM [phone]. Topic: Appointment Scheduling - Scheduling Inquiry for Clinic >> Mar 26, 2017  1:54 PM Guinevere FerrariMorris, Sharam[place] E, NT wrote: Reason for CRM: Patient called and wanted to see if he can switch from Dr. Earlene PlaterWallace to Dr. Jimmey RalphParker. Patient said he preferred a male MD. If ok please contact patient for scheduling.

## 2017-03-26 NOTE — Telephone Encounter (Signed)
Okay for transfer 

## 2017-03-29 NOTE — Telephone Encounter (Signed)
I left two messages for the patient to give us a call.

## 2017-04-06 ENCOUNTER — Ambulatory Visit (INDEPENDENT_AMBULATORY_CARE_PROVIDER_SITE_OTHER): Payer: Managed Care, Other (non HMO) | Admitting: Family Medicine

## 2017-04-06 ENCOUNTER — Encounter: Payer: Self-pay | Admitting: Family Medicine

## 2017-04-06 VITALS — BP 118/70 | HR 70 | Temp 98.3°F | Ht 69.25 in | Wt 191.6 lb

## 2017-04-06 DIAGNOSIS — K760 Fatty (change of) liver, not elsewhere classified: Secondary | ICD-10-CM | POA: Diagnosis not present

## 2017-04-06 DIAGNOSIS — E559 Vitamin D deficiency, unspecified: Secondary | ICD-10-CM | POA: Diagnosis not present

## 2017-04-06 DIAGNOSIS — Z0001 Encounter for general adult medical examination with abnormal findings: Secondary | ICD-10-CM

## 2017-04-06 DIAGNOSIS — R0981 Nasal congestion: Secondary | ICD-10-CM | POA: Diagnosis not present

## 2017-04-06 DIAGNOSIS — E785 Hyperlipidemia, unspecified: Secondary | ICD-10-CM | POA: Insufficient documentation

## 2017-04-06 DIAGNOSIS — M722 Plantar fascial fibromatosis: Secondary | ICD-10-CM | POA: Diagnosis not present

## 2017-04-06 DIAGNOSIS — K648 Other hemorrhoids: Secondary | ICD-10-CM | POA: Diagnosis not present

## 2017-04-06 DIAGNOSIS — Z114 Encounter for screening for human immunodeficiency virus [HIV]: Secondary | ICD-10-CM | POA: Diagnosis not present

## 2017-04-06 LAB — COMPREHENSIVE METABOLIC PANEL
ALBUMIN: 4.7 g/dL (ref 3.5–5.2)
ALK PHOS: 45 U/L (ref 39–117)
ALT: 31 U/L (ref 0–53)
AST: 19 U/L (ref 0–37)
BUN: 16 mg/dL (ref 6–23)
CO2: 28 mEq/L (ref 19–32)
CREATININE: 0.96 mg/dL (ref 0.40–1.50)
Calcium: 9.4 mg/dL (ref 8.4–10.5)
Chloride: 102 mEq/L (ref 96–112)
GFR: 91.36 mL/min (ref >60.00)
Glucose, Bld: 93 mg/dL (ref 70–99)
Potassium: 4.8 mEq/L (ref 3.5–5.1)
SODIUM: 138 meq/L (ref 135–145)
TOTAL PROTEIN: 7.5 g/dL (ref 6.0–8.3)
Total Bilirubin: 0.8 mg/dL (ref 0.2–1.2)

## 2017-04-06 LAB — VITAMIN D 25 HYDROXY (VIT D DEFICIENCY, FRACTURES): VITD: 28.58 ng/mL — AB (ref 30.00–100.00)

## 2017-04-06 LAB — CBC
HCT: 44.7 % (ref 39.0–52.0)
Hemoglobin: 15.3 g/dL (ref 13.0–17.0)
MCHC: 34.2 g/dL (ref 30.0–36.0)
MCV: 88.8 fl (ref 78.0–100.0)
Platelets: 230 10*3/uL (ref 150.0–400.0)
RBC: 5.04 Mil/uL (ref 4.22–5.81)
RDW: 12.8 % (ref 11.5–15.5)
WBC: 4.5 10*3/uL (ref 4.0–10.5)

## 2017-04-06 LAB — LIPID PANEL
CHOLESTEROL: 206 mg/dL — AB (ref 0–200)
HDL: 32.2 mg/dL — ABNORMAL LOW (ref >39.00)
LDL Cholesterol: 144 mg/dL — ABNORMAL HIGH (ref 0–99)
NonHDL: 173.86
Total CHOL/HDL Ratio: 6
Triglycerides: 148 mg/dL (ref 0.0–149.0)
VLDL: 29.6 mg/dL (ref 0.0–40.0)

## 2017-04-06 NOTE — Assessment & Plan Note (Signed)
Discussed home exercise program.

## 2017-04-06 NOTE — Patient Instructions (Addendum)
Preventive Care 42-64 Years, Male Preventive care refers to lifestyle choices and visits with your health care provider that can promote health and wellness. What does preventive care include?  A yearly physical exam. This is also called an annual well check.  Dental exams once or twice a year.  Routine eye exams. Ask your health care provider how often you should have your eyes checked.  Personal lifestyle choices, including: ? Daily care of your teeth and gums. ? Regular physical activity. ? Eating a healthy diet. ? Avoiding tobacco and drug use. ? Limiting alcohol use. ? Practicing safe sex. ? Taking low-dose aspirin every day starting at age 50. What happens during an annual well check? The services and screenings done by your health care provider during your annual well check will depend on your age, overall health, lifestyle risk factors, and family history of disease. Counseling Your health care provider may ask you questions about your:  Alcohol use.  Tobacco use.  Drug use.  Emotional well-being.  Home and relationship well-being.  Sexual activity.  Eating habits.  Work and work environment.  Screening You may have the following tests or measurements:  Height, weight, and BMI.  Blood pressure.  Lipid and cholesterol levels. These may be checked every 5 years, or more frequently if you are over 50 years old.  Skin check.  Lung cancer screening. You may have this screening every year starting at age 55 if you have a 30-pack-year history of smoking and currently smoke or have quit within the past 15 years.  Fecal occult blood test (FOBT) of the stool. You may have this test every year starting at age 50.  Flexible sigmoidoscopy or colonoscopy. You may have a sigmoidoscopy every 5 years or a colonoscopy every 10 years starting at age 50.  Prostate cancer screening. Recommendations will vary depending on your family history and other risks.  Hepatitis C  blood test.  Hepatitis B blood test.  Sexually transmitted disease (STD) testing.  Diabetes screening. This is done by checking your blood sugar (glucose) after you have not eaten for a while (fasting). You may have this done every 1-3 years.  Discuss your test results, treatment options, and if necessary, the need for more tests with your health care provider. Vaccines Your health care provider may recommend certain vaccines, such as:  Influenza vaccine. This is recommended every year.  Tetanus, diphtheria, and acellular pertussis (Tdap, Td) vaccine. You may need a Td booster every 10 years.  Varicella vaccine. You may need this if you have not been vaccinated.  Zoster vaccine. You may need this after age 60.  Measles, mumps, and rubella (MMR) vaccine. You may need at least one dose of MMR if you were born in 1957 or later. You may also need a second dose.  Pneumococcal 13-valent conjugate (PCV13) vaccine. You may need this if you have certain conditions and have not been vaccinated.  Pneumococcal polysaccharide (PPSV23) vaccine. You may need one or two doses if you smoke cigarettes or if you have certain conditions.  Meningococcal vaccine. You may need this if you have certain conditions.  Hepatitis A vaccine. You may need this if you have certain conditions or if you travel or work in places where you may be exposed to hepatitis A.  Hepatitis B vaccine. You may need this if you have certain conditions or if you travel or work in places where you may be exposed to hepatitis B.  Haemophilus influenzae type b (Hib) vaccine.   You may need this if you have certain risk factors.  Talk to your health care provider about which screenings and vaccines you need and how often you need them. This information is not intended to replace advice given to you by your health care provider. Make sure you discuss any questions you have with your health care provider. Document Released: 03/05/2015  Document Revised: 10/27/2015 Document Reviewed: 12/08/2014 Elsevier Interactive Patient Education  2018 [employer]vier Inc[employer]  

## 2017-04-06 NOTE — Progress Notes (Signed)
Subjective:  Thomas Cooley is a 42 y.o. male who presents today for his annual comprehensive physical exam.    HPI:  He has one acute complaint today. 1.  Sinus pressure.  Started about 10 days ago.  Stable over that time.  No fevers or chills.  No sore throat.  Associated with mild eye pain vision sensitive eyes.  Also with sinus congestion and runny nose.  Lifestyle Diet: No specific diets.  Exercise: Goes to gym three times weekly. 45 minutes at a time. Does cardio workout.   No flowsheet data found.  Health Maintenance Due  Topic Date Due  . HIV Screening  05/31/1990  . TETANUS/TDAP  05/31/1994    ROS: Positive for sinus congestion, mouth sores, sinus pressure, eye pain, photophobia, vision problems, urinary frequency, otherwise a 10 point review of systems was performed and was negative  PMH:  The following were reviewed and entered/updated in epic: Past Medical History:  Diagnosis Date  . Hyperlipidemia   . Internal hemorrhoid, bleeding   . Vitamin D deficiency    Patient Active Problem List   Diagnosis Date Noted  . Dyslipidemia 04/06/2017  . Plantar fasciitis of right foot 03/27/2016  . Vitamin D deficiency 03/27/2016  . Bleeding internal hemorrhoids 03/27/2016  . Tendonitis of knee, right 03/27/2016  . BMI 27.0-27.9,adult 03/27/2016  . 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 NAFL (nonalcoholic fatty liver) 03/27/2016   Past Surgical History:  Procedure Laterality Date  . NASAL SINUS SURGERY      Family History  Problem Relation Age of Onset  . Diabetes Mother   . Anal fissures Mother   . Diabetes Father   . Kidney failure Father   . Anal fissures Father     Medications- reviewed and updated Current Outpatient Medications  Medication Sig Dispense Refill  . meloxicam (MOBIC) 15 MG tablet Take 1 tablet (15 mg total) by mouth daily. 30 tablet 0  . niacin 250 MG CR capsule Take 1 capsule (250 mg total) by mouth at bedtime. 30 capsule 2  . Vitamin  D, Ergocalciferol, (DRISDOL) 50000 units CAPS capsule Take 50,000 Units by mouth every 7 (seven) days.     No current facility-administered medications for this visit.     Allergies-reviewed and updated Allergies  Allergen Reactions  . Floraquin [Iodoquinol] Rash    Social History   Socioeconomic History  . Marital status: Married    Spouse name: None  . Number of children: 2  . Years of education: None  . Highest education level: None  Social Needs  . Financial resource strain: None  . Food insecurity - worry: None  . Food insecurity - inability: None  . Transportation needs - medical: None  . Transportation needs - non-medical: None  Occupational History  . Occupation: Printmaker  Tobacco Use  . Smoking status: Never Smoker  . Smokeless tobacco: Never Used  Substance and Sexual Activity  . Alcohol use: Yes    Comment: Social   . Drug use: No  . Sexual activity: Yes    Partners: Female  Other Topics Concern  . None  Social History Narrative  . None   Objective:  Physical Exam: BP 118/70 (BP Location: Left Arm, Patient Position: Sitting, Cuff Size: Normal)   Pulse 70   Temp 98.3 F (36.8 C) (Oral)   Ht 5' 9.25" (1.759 m)   Wt 191 lb 9.6 oz (86.9 kg)   SpO2 99%   BMI 28.09 kg/m   Body  mass index is 28.09 kg/m. Wt Readings from Last 3 Encounters:  04/06/17 191 lb 9.6 oz (86.9 kg)  12/22/16 191 lb 12.8 oz (87 kg)  03/29/16 193 lb 4 oz (87.7 kg)   Gen: NAD, resting comfortably HEENT: TMs normal bilaterally. OP clear. No thyromegaly noted.  CV: RRR with no murmurs appreciated Pulm: NWOB, CTAB with no crackles, wheezes, or rhonchi GI: Normal bowel sounds present. Soft, Nontender, Nondistended. MSK: no edema, cyanosis, or clubbing noted Skin: warm, dry Neuro: CN2-12 grossly intact. Strength 5/5 in upper and lower extremities. Reflexes symmetric and intact bilaterally.  Psych: Normal affect and thought content  Assessment/Plan:  Plantar fasciitis  of right foot Discussed home exercise program.  Nasal congestion Likely secondary to viral URI with possible allergic component.  Reassured patient.  Recommended over-the-counter second-generation oral antihistamine such as Claritin or Zyrtec.  Vitamin D deficiency Check vitamin D level today.  Preventative Healthcare: Check fasting lipid panel.  Check HIV antibody.  Patient Counseling:  -Nutrition: Stressed importance of moderation in sodium/caffeine intake, saturated fat and cholesterol, caloric balance, sufficient intake of fresh fruits, vegetables, and fiber.  -Stressed the importance of regular exercise.   -Substance Abuse: Discussed cessation/primary prevention of tobacco, alcohol, or other drug use; driving or other dangerous activities under the influence; availability of treatment for abuse.   -Injury prevention: Discussed safety belts, safety helmets, smoke detector, smoking near bedding or upholstery.   -Sexuality: Discussed sexually transmitted diseases, partner selection, use of condoms, avoidance of unintended pregnancy and contraceptive alternatives.   -Dental health: Discussed importance of regular tooth brushing, flossing, and dental visits.  -Health maintenance and immunizations reviewed. Please refer to Health maintenance section.  Return to care in 1 year for next preventative visit.   Katina Degreealeb M. Jimmey RalphParker, MD 04/06/2017 12:06 PM

## 2017-04-07 LAB — HIV ANTIBODY (ROUTINE TESTING W REFLEX): HIV: NONREACTIVE

## 2017-04-25 ENCOUNTER — Telehealth: Payer: Self-pay | Admitting: Gastroenterology

## 2017-04-25 NOTE — Telephone Encounter (Signed)
Patient can feel a "lump" at his rectum. He is using hemorrhoid cream and feels this why he presently does not have pain or bleeding. He does state he would like this to be examined to determine "what it is." Appointment made.

## 2017-04-26 ENCOUNTER — Ambulatory Visit: Payer: Managed Care, Other (non HMO) | Admitting: Gastroenterology

## 2017-04-26 ENCOUNTER — Encounter: Payer: Self-pay | Admitting: Gastroenterology

## 2017-04-26 VITALS — BP 110/76 | HR 80 | Ht 69.0 in | Wt 193.8 lb

## 2017-04-26 DIAGNOSIS — K6289 Other specified diseases of anus and rectum: Secondary | ICD-10-CM | POA: Diagnosis not present

## 2017-04-26 MED ORDER — HYDROCORTISONE ACETATE 25 MG RE SUPP: 25.0000 mg | Freq: Two times a day (BID) | RECTAL | 1 refills | Status: DC

## 2017-04-26 NOTE — Patient Instructions (Signed)
If you are age 42 or older, your body mass index should be between 23-30. Your Body mass index is 28.62 kg/m. If this is out of the aforementioned range listed, please consider follow up with your Primary Care Provider.  If you are age 42 or younger, your body mass index should be between 19-25. Your Body mass index is 28.62 kg/m. If this is out of the aformentioned range listed, please consider follow up with your Primary Care Provider.   We have sent the following medications to your pharmacy for you to pick up at your convenience: Hydrocortisone Supp.  1 per rectum twice daily for 5 days  Call with an update on  Monday.

## 2017-04-26 NOTE — Progress Notes (Signed)
     04/26/2017 Thomas Cooley [id number] 1975/06/29   HISTORY OF PRESENT ILLNESS:  42 year old male who was here previously with complaints of rectal bleeding, suspected to be from internal hemorrhoids.  He was added on today for evaluation of a painful lump that was near his anus.  He tells me that it popped up on Monday.  Has already improved since then.  Says that it caused a burning type pain.  No bleeding.   Past Medical History:  Diagnosis Date  . Hyperlipidemia   . Internal hemorrhoid, bleeding   . Vitamin D deficiency    Past Surgical History:  Procedure Laterality Date  . NASAL SINUS SURGERY      reports that  has never smoked. he has never used smokeless tobacco. He reports that he drinks alcohol. He reports that he does not use drugs. family history includes Anal fissures in his father and mother; Diabetes in his father and mother; Kidney failure in his father. Allergies  Allergen Reactions  . Floraquin [Iodoquinol] Rash      Outpatient Encounter Medications as of 04/26/2017  Medication Sig  . niacin 250 MG CR capsule Take 1 capsule (250 mg total) by mouth at bedtime.  . Vitamin D, Ergocalciferol, (DRISDOL) 50000 units CAPS capsule Take 1,000 Units by mouth every 7 (seven) days.   . [DISCONTINUED] meloxicam (MOBIC) 15 MG tablet Take 1 tablet (15 mg total) by mouth daily.   No facility-administered encounter medications on file as of 04/26/2017.      REVIEW OF SYSTEMS  : All other systems reviewed and negative except where noted in the History of Present Illness.   PHYSICAL EXAM: BP 110/76   Pulse 80   Ht 5\' 9"  (1.753 m)   Wt 193 lb 12.8 oz (87.9 kg)   BMI 28.62 kg/m  General: Well developed male in no acute distress Head: Normocephalic and atraumatic Eyes:  Sclerae anicteric, conjunctiva pink. Ears: Normal auditory acuity Rectal:  No abnormalities noted externally except for maybe a small area of slight fullness on the left.  No masses or tenderness noted on  DRE.   Musculoskeletal: Symmetrical with no gross deformities  Skin: No lesions on visible extremities Extremities: No edema  Neurological: Alert oriented x 4, grossly non-focal Psychological:  Alert and cooperative. Normal mood and affect  ASSESSMENT AND PLAN: *Rectal pain/lump:  Has already improved over the past couple of days.  I really do not seen any significant findings on today's exam.  Will try hydrocortisone cream/suppositories for the next couple of days and I will have him call back on Monday to give us an update.   CC:  Helane RimaWallace, Erica, DO

## 2017-04-30 ENCOUNTER — Telehealth: Payer: Self-pay | Admitting: Gastroenterology

## 2017-04-30 NOTE — Telephone Encounter (Signed)
The pt states the rectal pain/lump persists and actually feels a lot firmer in the area.  He stopped the hydrocortisone supp because it seemed to irritate the area more.  There is pain however, it is not severe.  Please advise.

## 2017-04-30 NOTE — Telephone Encounter (Signed)
I called the patient back and spoke with him.  He says that the lump is now firmer again and hydrocortisone no longer seems to be working.  It is not really painful.  I expressed to him that I did not appreciate much in regards to findings on his exam when he was here in the office.  He was given an appointment to come back here again next week to be reexamined.  Other options I gave him was to call back later in the week to see if any other appointments were open sooner versus seeing his PCP versus [facility].

## 2017-04-30 NOTE — Telephone Encounter (Signed)
Offer him an appt with another APP, maybe Gunnar Fusiaula since I think that she has seen him before, if there is something available this week to see if she sees anything.  When I examined him last week I did not see anything.  He was reporting external symptoms so I looked outside and did a DRE but did not perform anoscopy.

## 2017-04-30 NOTE — Telephone Encounter (Signed)
I scheduled the pt with Gunnar FusiPaula on 3/19.  That is the soonest appt she has.  The pt would like to speak directly to [place].  Shanda BumpsJessica will you call the pt?

## 2017-05-02 NOTE — Progress Notes (Signed)
Reviewed and agree with documentation and assessment and plan. K. Veena Gricel Copen , MD   

## 2017-05-08 ENCOUNTER — Ambulatory Visit: Payer: Managed Care, Other (non HMO) | Admitting: Nurse Practitioner

## 2017-05-11 NOTE — Telephone Encounter (Signed)
Patient didn't show for appt.

## 2018-02-14 ENCOUNTER — Ambulatory Visit (INDEPENDENT_AMBULATORY_CARE_PROVIDER_SITE_OTHER): Payer: Managed Care, Other (non HMO)

## 2018-02-14 ENCOUNTER — Ambulatory Visit: Payer: Managed Care, Other (non HMO) | Admitting: Family Medicine

## 2018-02-14 ENCOUNTER — Encounter: Payer: Self-pay | Admitting: Family Medicine

## 2018-02-14 VITALS — BP 110/68 | HR 78 | Temp 98.2°F | Ht 69.0 in | Wt 194.0 lb

## 2018-02-14 DIAGNOSIS — E559 Vitamin D deficiency, unspecified: Secondary | ICD-10-CM

## 2018-02-14 DIAGNOSIS — E785 Hyperlipidemia, unspecified: Secondary | ICD-10-CM | POA: Diagnosis not present

## 2018-02-14 DIAGNOSIS — M542 Cervicalgia: Secondary | ICD-10-CM | POA: Diagnosis not present

## 2018-02-14 LAB — COMPREHENSIVE METABOLIC PANEL
ALBUMIN: 4.8 g/dL (ref 3.5–5.2)
ALK PHOS: 46 U/L (ref 39–117)
ALT: 27 U/L (ref 0–53)
AST: 18 U/L (ref 0–37)
BUN: 13 mg/dL (ref 6–23)
CALCIUM: 9.8 mg/dL (ref 8.4–10.5)
CO2: 25 mEq/L (ref 19–32)
CREATININE: 0.87 mg/dL (ref 0.40–1.50)
Chloride: 103 mEq/L (ref 96–112)
GFR: 101.93 mL/min (ref >60.00)
Glucose, Bld: 102 mg/dL — ABNORMAL HIGH (ref 70–99)
Potassium: 4.3 mEq/L (ref 3.5–5.1)
Sodium: 137 mEq/L (ref 135–145)
TOTAL PROTEIN: 7.4 g/dL (ref 6.0–8.3)
Total Bilirubin: 0.7 mg/dL (ref 0.2–1.2)

## 2018-02-14 LAB — LIPID PANEL
CHOLESTEROL: 210 mg/dL — AB (ref 0–200)
HDL: 34.1 mg/dL — ABNORMAL LOW (ref >39.00)
LDL CALC: 145 mg/dL — AB (ref 0–99)
NonHDL: 175.54
Total CHOL/HDL Ratio: 6
Triglycerides: 151 mg/dL — ABNORMAL HIGH (ref 0.0–149.0)
VLDL: 30.2 mg/dL (ref 0.0–40.0)

## 2018-02-14 LAB — CBC
HCT: 44.3 % (ref 39.0–52.0)
HEMOGLOBIN: 15.3 g/dL (ref 13.0–17.0)
MCHC: 34.5 g/dL (ref 30.0–36.0)
MCV: 88.1 fl (ref 78.0–100.0)
PLATELETS: 234 10*3/uL (ref 150.0–400.0)
RBC: 5.02 Mil/uL (ref 4.22–5.81)
RDW: 12.8 % (ref 11.5–15.5)
WBC: 4.4 10*3/uL (ref 4.0–10.5)

## 2018-02-14 LAB — URIC ACID: Uric Acid, Serum: 7.7 mg/dL (ref 4.0–7.8)

## 2018-02-14 LAB — VITAMIN D 25 HYDROXY (VIT D DEFICIENCY, FRACTURES): VITD: 26.61 ng/mL — ABNORMAL LOW (ref 30.00–100.00)

## 2018-02-14 LAB — TSH: TSH: 2.07 u[IU]/mL (ref 0.35–4.50)

## 2018-02-14 MED ORDER — DICLOFENAC SODIUM 75 MG PO TBEC: 75.0000 mg | DELAYED_RELEASE_TABLET | Freq: Two times a day (BID) | ORAL | 0 refills | Status: DC

## 2018-02-14 MED ORDER — CYCLOBENZAPRINE HCL 10 MG PO TABS: 10.0000 mg | ORAL_TABLET | Freq: Three times a day (TID) | ORAL | 0 refills | Status: DC | PRN

## 2018-02-14 NOTE — Assessment & Plan Note (Signed)
Check lipid panel  

## 2018-02-14 NOTE — Assessment & Plan Note (Signed)
Check vitamin D level 

## 2018-02-14 NOTE — Patient Instructions (Signed)
It was very nice to see you today!  I think you are having muscular irritation.  Please try taking the Flexeril before going to bed to see if this helps.  You can also take the diclofenac twice daily as needed.  We will check blood work today.  We will also check an x-ray.  Please work on the exercises.  Limit of your symptoms worsen or not improve in the next few weeks.  Take care, Dr Jimmey RalphParker

## 2018-02-14 NOTE — Progress Notes (Signed)
   Subjective:  Thomas Cooley is a 42 y.o. male who presents today for same-day appointment with a chief complaint of neck pain.   HPI:  Neck Pain, Acute problem Started 2-3 months ago.  Stable over the last several weeks.  Pain almost always occurs at night and early in the morning.  Notes that depending on his sleep position symptoms will be worsened.  Typically sleeping flat on his back make symptoms worse.  Symptoms are improved when laying on his right side.  Tried by new pillows which has not helped.  Pain radiates from the back of his head into the front part of his head.  Sometimes causes headaches.  No other treatments tried.  No weakness or numbness.  No bowel or bladder incontinence.  No urinary retention.  No other obvious alleviating or aggravating factors.  Vitamin D deficiency Was previously on vitamin D 50,000 international units weekly.  Not currently on any supplementation.  Would like to have level rechecked today.  ROS: Per HPI  PMH: He reports that he has never smoked. He has never used smokeless tobacco. He reports current alcohol use. He reports that he does not use drugs.  Objective:  Physical Exam: BP 110/68 (BP Location: Left Arm, Patient Position: Sitting, Cuff Size: Normal)   Pulse 78   Temp 98.2 F (36.8 C) (Oral)   Ht 5\' 9"  (1.753 m)   Wt 194 lb (88 kg)   SpO2 98%   BMI 28.65 kg/m   Gen: NAD, resting comfortably CV: RRR with no murmurs appreciated Pulm: NWOB, CTAB with no crackles, wheezes, or rhonchi MSK:  -Neck: No deformities.  Tender to palpation along posterior occipital muscles and bilateral paraspinal cervical muscles.  Full range of motion throughout.  Spurling negative bilaterally -Upper extremities: No deformities.  Strength out of 5 throughout.  Sensation light touch intact throughout. -Lower extremities: Strength 5 out of 5 throughout.  Sensation light touch intact throughout.  Assessment/Plan:  Neck pain Seems to be most likely  secondary to muscular strain related to his sleep position.  No red flags.  Start diclofenac 75 mg twice daily as needed and Flexeril 10 mg 3 times daily as needed.  Discussed home exercise program and handout was given.  Check CBC, CMET, TSH, and uric acid level.  Check neck plain film.  Discussed reasons to return to care.  Follow-up with sports medicine symptoms worsen or not improve with above.  Vitamin D deficiency Check vitamin D level.  Dyslipidemia Check lipid panel.  Thomas Degreealeb M. Jimmey RalphParker, MD 02/14/2018 12:01 PM

## 2018-02-15 ENCOUNTER — Other Ambulatory Visit: Payer: Self-pay

## 2018-02-15 MED ORDER — CHOLECALCIFEROL 1.25 MG (50000 UT) PO TABS: ORAL_TABLET | ORAL | 0 refills | Status: DC

## 2018-02-15 NOTE — Progress Notes (Signed)
Please inform patient of the following:  Vitamin d was still low. Recommend taking OTC vitamin D 5000 IU daily or prescription 50000 IU weekly. We can send in if needed. Recommend recheck in 3-6 months.   Cholesterol still slightly high. Do not need to start meds, but recommend continuing to work on diet and exercise and we can recheck in 1 year.   His other blood work is all normal.  His xray is normal also. No signs of arthritis. Recommend continuing the meds we discussed at his office visit. Would like for him to see Dr Berline Choughigby if his symptoms do not improve in the next few weeks.  Katina Degreealeb M. Jimmey RalphParker, MD 02/15/2018 8:54 AM

## 2018-12-13 ENCOUNTER — Encounter: Payer: Self-pay | Admitting: Family Medicine

## 2018-12-13 ENCOUNTER — Other Ambulatory Visit: Payer: Self-pay

## 2018-12-13 ENCOUNTER — Encounter: Payer: Managed Care, Other (non HMO) | Admitting: Family Medicine

## 2018-12-13 ENCOUNTER — Ambulatory Visit (INDEPENDENT_AMBULATORY_CARE_PROVIDER_SITE_OTHER): Payer: Managed Care, Other (non HMO) | Admitting: Family Medicine

## 2018-12-13 VITALS — BP 122/76 | HR 69 | Temp 98.0°F | Ht 69.0 in | Wt 186.4 lb

## 2018-12-13 DIAGNOSIS — Z23 Encounter for immunization: Secondary | ICD-10-CM

## 2018-12-13 DIAGNOSIS — E785 Hyperlipidemia, unspecified: Secondary | ICD-10-CM | POA: Diagnosis not present

## 2018-12-13 DIAGNOSIS — Z0001 Encounter for general adult medical examination with abnormal findings: Secondary | ICD-10-CM

## 2018-12-13 DIAGNOSIS — E559 Vitamin D deficiency, unspecified: Secondary | ICD-10-CM

## 2018-12-13 DIAGNOSIS — Z6827 Body mass index (BMI) 27.0-27.9, adult: Secondary | ICD-10-CM

## 2018-12-13 DIAGNOSIS — E663 Overweight: Secondary | ICD-10-CM

## 2018-12-13 DIAGNOSIS — R739 Hyperglycemia, unspecified: Secondary | ICD-10-CM | POA: Diagnosis not present

## 2018-12-13 DIAGNOSIS — G5701 Lesion of sciatic nerve, right lower limb: Secondary | ICD-10-CM

## 2018-12-13 LAB — CBC
HCT: 45 % (ref 39.0–52.0)
Hemoglobin: 15.5 g/dL (ref 13.0–17.0)
MCHC: 34.4 g/dL (ref 30.0–36.0)
MCV: 88.5 fl (ref 78.0–100.0)
Platelets: 219 10*3/uL (ref 150.0–400.0)
RBC: 5.08 Mil/uL (ref 4.22–5.81)
RDW: 13 % (ref 11.5–15.5)
WBC: 5 10*3/uL (ref 4.0–10.5)

## 2018-12-13 LAB — COMPREHENSIVE METABOLIC PANEL
ALT: 33 U/L (ref 0–53)
AST: 21 U/L (ref 0–37)
Albumin: 4.9 g/dL (ref 3.5–5.2)
Alkaline Phosphatase: 51 U/L (ref 39–117)
BUN: 12 mg/dL (ref 6–23)
CO2: 25 mEq/L (ref 19–32)
Calcium: 9.5 mg/dL (ref 8.4–10.5)
Chloride: 103 mEq/L (ref 96–112)
Creatinine, Ser: 0.9 mg/dL (ref 0.40–1.50)
GFR: 91.87 mL/min (ref >60.00)
Glucose, Bld: 93 mg/dL (ref 70–99)
Potassium: 4.8 mEq/L (ref 3.5–5.1)
Sodium: 137 mEq/L (ref 135–145)
Total Bilirubin: 0.8 mg/dL (ref 0.2–1.2)
Total Protein: 7.7 g/dL (ref 6.0–8.3)

## 2018-12-13 LAB — LIPID PANEL
Cholesterol: 221 mg/dL — ABNORMAL HIGH (ref 0–200)
HDL: 34.6 mg/dL — ABNORMAL LOW (ref >39.00)
LDL Cholesterol: 157 mg/dL — ABNORMAL HIGH (ref 0–99)
NonHDL: 186.29
Total CHOL/HDL Ratio: 6
Triglycerides: 144 mg/dL (ref 0.0–149.0)
VLDL: 28.8 mg/dL (ref 0.0–40.0)

## 2018-12-13 LAB — TSH: TSH: 1.74 u[IU]/mL (ref 0.35–4.50)

## 2018-12-13 LAB — HEMOGLOBIN A1C: Hgb A1c MFr Bld: 5.3 % (ref 4.6–6.5)

## 2018-12-13 LAB — VITAMIN D 25 HYDROXY (VIT D DEFICIENCY, FRACTURES): VITD: 27.73 ng/mL — ABNORMAL LOW (ref 30.00–100.00)

## 2018-12-13 MED ORDER — MELOXICAM 15 MG PO TABS: 15.0000 mg | ORAL_TABLET | Freq: Every day | ORAL | 0 refills | Status: DC

## 2018-12-13 NOTE — Assessment & Plan Note (Signed)
Check A1c. 

## 2018-12-13 NOTE — Progress Notes (Signed)
 Chief Complaint:  Thomas Cooley is a 43 y.o. male who presents today for his annual comprehensive physical exam.    Assessment/Plan:  Hyperglycemia Check A1c.  Dyslipidemia Check CBC, C met, TSH, and lipid panel.  Vitamin D deficiency Check vitamin D level.  Piriformis syndrome No red flags.  Start Mobic 15 mg daily for the next 1 to 2 weeks.  Discussed home exercises and handout was given.  If no improvement will need referral to PT and/or sports med.  Body mass index is 27.52 kg/m. / Overweight BMI Metric Follow Up - 12/13/18 0913      BMI Metric Follow Up-Please document annually   BMI Metric Follow Up  Education provided       Preventative Healthcare: Flu vaccine and Tdap given today.  Check CBC, C met, TSH, lipid panel, and A1c.  Patient Counseling(The following topics were reviewed and/or handout was given):  -Nutrition: Stressed importance of moderation in sodium/caffeine intake, saturated fat and cholesterol, caloric balance, sufficient intake of fresh fruits, vegetables, and fiber.  -Stressed the importance of regular exercise.   -Substance Abuse: Discussed cessation/primary prevention of tobacco, alcohol, or other drug use; driving or other dangerous activities under the influence; availability of treatment for abuse.   -Injury prevention: Discussed safety belts, safety helmets, smoke detector, smoking near bedding or upholstery.   -Sexuality: Discussed sexually transmitted diseases, partner selection, use of condoms, avoidance of unintended pregnancy and contraceptive alternatives.   -Dental health: Discussed importance of regular tooth brushing, flossing, and dental visits.  -Health maintenance and immunizations reviewed. Please refer to Health maintenance section.  Return to care in 1 year for next preventative visit.     Subjective:  HPI:  Low Back Pain Started about a month or two ago. No obvious injuries or other precipitating events.  Located in right  lower back.  He has been sitting more due to working from home.  He is not sure if this is made the symptoms worse.  Sometimes radiates into his right thigh.  Worsened with raising his right leg.  He has tried ibuprofen and diclofenac which helped however wear off after a few hours.  No weakness or numbness.  No bowel or bladder changes.  No other obvious aggravating or alleviating factors.  Lifestyle Diet: No specific diets or eating plans.  Exercise: Works on yoga. Likes to go on walks.   Depression screen PHQ 2/9 12/13/2018  Decreased Interest 0  Down, Depressed, Hopeless 0  PHQ - 2 Score 0    Health Maintenance Due  Topic Date Due  . TETANUS/TDAP  05/31/1994  . INFLUENZA VACCINE  09/21/2018     ROS: Per HPI, otherwise a complete review of systems was negative.   PMH:  The following were reviewed and entered/updated in epic: Past Medical History:  Diagnosis Date  . Hyperlipidemia   . Internal hemorrhoid, bleeding   . Vitamin D deficiency    Patient Active Problem List   Diagnosis Date Noted  . Hyperglycemia 12/13/2018  . Dyslipidemia 04/06/2017  . Plantar fasciitis of right foot 03/27/2016  . Vitamin D deficiency 03/27/2016  . Bleeding internal hemorrhoids 03/27/2016  . Tendonitis of knee, right 03/27/2016  . 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 NAFL (nonalcoholic fatty liver) 03/27/2016   Past Surgical History:  Procedure Laterality Date  . NASAL SINUS SURGERY      Family History  Problem Relation Age of Onset  . Diabetes Mother   . Anal fissures Mother   .   Diabetes Father   . Kidney failure Father   . Anal fissures Father     Medications- reviewed and updated Current Outpatient Medications  Medication Sig Dispense Refill  . meloxicam (MOBIC) 15 MG tablet Take 1 tablet (15 mg total) by mouth daily. 30 tablet 0   No current facility-administered medications for this visit.     Allergies-reviewed and updated Allergies  Allergen Reactions  .  Floraquin [Iodoquinol] Rash    Social History   Socioeconomic History  . Marital status: Married    Spouse name: Not on file  . Number of children: 2  . Years of education: Not on file  . Highest education level: Not on file  Occupational History  . Occupation: [employer]  Social Needs  . Financial resource strain: Not on file  . Food insecurity    Worry: Not on file    Inability: Not on file  . Transportation needs    Medical: Not on file    Non-medical: Not on file  Tobacco Use  . Smoking status: Never Smoker  . Smokeless tobacco: Never Used  Substance and Sexual Activity  . Alcohol use: Yes    Comment: Social   . Drug use: No  . Sexual activity: Yes    Partners: Female  Lifestyle  . Physical activity    Days per week: Not on file    Minutes per session: Not on file  . Stress: Not on file  Relationships  . Social connections    Talks on phone: Not on file    Gets together: Not on file    Attends religious service: Not on file    Active member of club or organization: Not on file    Attends meetings of clubs or organizations: Not on file    Relationship status: Not on file  Other Topics Concern  . Not on file  Social History Narrative  . Not on file        Objective:  Physical Exam: BP 122/76   Pulse 69   Temp 98 F (36.7 C)   Ht 5' 9" (1.753 m)   Wt 186 lb 6.1 oz (84.5 kg)   SpO2 99%   BMI 27.52 kg/m   Body mass index is 27.52 kg/m. Wt Readings from Last 3 Encounters:  12/13/18 186 lb 6.1 oz (84.5 kg)  02/14/18 194 lb (88 kg)  04/26/17 193 lb 12.8 oz (87.9 kg)   Gen: NAD, resting comfortably HEENT: TMs normal bilaterally. OP clear. No thyromegaly noted.  CV: RRR with no murmurs appreciated Pulm: NWOB, CTAB with no crackles, wheezes, or rhonchi GI: Normal bowel sounds present. Soft, Nontender, Nondistended. MSK: no edema, cyanosis, or clubbing noted - Back: No deformities.  Tender to palpation along lower right lumbar paraspinal  muscles. -Lower extremities: No deformities.  Neurovascular intact distally.  Pain elicited with extension of right hip. Skin: warm, dry Neuro: CN2-12 grossly intact. Strength 5/5 in upper and lower extremities. Reflexes symmetric and intact bilaterally.  Psych: Normal affect and thought content     Caleb M. Parker, MD 12/13/2018 9:19 AM  

## 2018-12-13 NOTE — Patient Instructions (Signed)
It was very nice to see you today!  Please use the meloxicam every day for the next 1 to 2-week.  Please work on stretches and exercises.  If your symptoms worsen or not improve in the next 1 to 2 weeks, please let me know.  We will give you a flu vaccine and tetanus vaccine today.  We will check blood work today.  Come back in 1 year for your next physical, or sooner if needed.  Take care, Dr Jerline Pain  Please try these tips to maintain a healthy lifestyle:   Eat at least 3 REAL meals and 1-2 snacks per day.  Aim for no more than 5 hours between eating.  If you eat breakfast, please do so within one hour of getting up.    Obtain twice as many fruits/vegetables as protein or carbohydrate foods for both lunch and dinner. (Half of each meal should be fruits/vegetables, one quarter protein, and one quarter starchy carbs)   Cut down on sweet beverages. This includes juice, soda, and sweet tea.    Exercise at least 150 minutes every week.    Preventive Care 4-69 Years Old, Male Preventive care refers to lifestyle choices and visits with your health care provider that can promote health and wellness. This includes:  A yearly physical exam. This is also called an annual well check.  Regular dental and eye exams.  Immunizations.  Screening for certain conditions.  Healthy lifestyle choices, such as eating a healthy diet, getting regular exercise, not using drugs or products that contain nicotine and tobacco, and limiting alcohol use. What can I expect for my preventive care visit? Physical exam Your health care provider will check:  Height and weight. These may be used to calculate body mass index (BMI), which is a measurement that tells if you are at a healthy weight.  Heart rate and blood pressure.  Your skin for abnormal spots. Counseling Your health care provider may ask you questions about:  Alcohol, tobacco, and drug use.  Emotional well-being.  Home and  relationship well-being.  Sexual activity.  Eating habits.  Work and work Statistician. What immunizations do I need?  Influenza (flu) vaccine  This is recommended every year. Tetanus, diphtheria, and pertussis (Tdap) vaccine  You may need a Td booster every 10 years. Varicella (chickenpox) vaccine  You may need this vaccine if you have not already been vaccinated. Zoster (shingles) vaccine  You may need this after age 63. Measles, mumps, and rubella (MMR) vaccine  You may need at least one dose of MMR if you were born in 1957 or later. You may also need a second dose. Pneumococcal conjugate (PCV13) vaccine  You may need this if you have certain conditions and were not previously vaccinated. Pneumococcal polysaccharide (PPSV23) vaccine  You may need one or two doses if you smoke cigarettes or if you have certain conditions. Meningococcal conjugate (MenACWY) vaccine  You may need this if you have certain conditions. Hepatitis A vaccine  You may need this if you have certain conditions or if you travel or work in places where you may be exposed to hepatitis A. Hepatitis B vaccine  You may need this if you have certain conditions or if you travel or work in places where you may be exposed to hepatitis B. Haemophilus influenzae type b (Hib) vaccine  You may need this if you have certain risk factors. Human papillomavirus (HPV) vaccine  If recommended by your health care provider, you may need three doses  over 6 months. You may receive vaccines as individual doses or as more than one vaccine together in one shot (combination vaccines). Talk with your health care provider about the risks and benefits of combination vaccines. What tests do I need? Blood tests  Lipid and cholesterol levels. These may be checked every 5 years, or more frequently if you are over 38 years old.  Hepatitis C test.  Hepatitis B test. Screening  Lung cancer screening. You may have this  screening every year starting at age 23 if you have a 30-pack-year history of smoking and currently smoke or have quit within the past 15 years.  Prostate cancer screening. Recommendations will vary depending on your family history and other risks.  Colorectal cancer screening. All adults should have this screening starting at age 53 and continuing until age 70. Your health care provider may recommend screening at age 28 if you are at increased risk. You will have tests every 1-10 years, depending on your results and the type of screening test.  Diabetes screening. This is done by checking your blood sugar (glucose) after you have not eaten for a while (fasting). You may have this done every 1-3 years.  Sexually transmitted disease (STD) testing. Follow these instructions at home: Eating and drinking  Eat a diet that includes fresh fruits and vegetables, whole grains, lean protein, and low-fat dairy products.  Take vitamin and mineral supplements as recommended by your health care provider.  Do not drink alcohol if your health care provider tells you not to drink.  If you drink alcohol: ? Limit how much you have to 0-2 drinks a day. ? Be aware of how much alcohol is in your drink. In the [place] one drink equals one 12 oz bottle of beer (355 mL), one 5 oz glass of wine (148 mL), or one 1 oz glass of hard liquor (44 mL). Lifestyle  Take daily care of your teeth and gums.  Stay active. Exercise for at least 30 minutes on 5 or more days each week.  Do not use any products that contain nicotine or tobacco, such as cigarettes, e-cigarettes, and chewing tobacco. If you need help quitting, ask your health care provider.  If you are sexually active, practice safe sex. Use a condom or other form of protection to prevent STIs (sexually transmitted infections).  Talk with your health care provider about taking a low-dose aspirin every day starting at age 53. What's next?  Go to your health care  provider once a year for a well check visit.  Ask your health care provider how often you should have your eyes and teeth checked.  Stay up to date on all vaccines. This information is not intended to replace advice given to you by your health care provider. Make sure you discuss any questions you have with your health care provider. Document Released: 03/05/2015 Document Revised: 01/31/2018 Document Reviewed: 01/31/2018 Elsevier Patient Education  2020 [employer].

## 2018-12-13 NOTE — Assessment & Plan Note (Signed)
Check vitamin D level 

## 2018-12-13 NOTE — Progress Notes (Signed)
Please inform patient of the following:  Vitamin D mildly low - recommend he start OTC 2000IU daily. We can recheck in 6-12 months. Cholesterol mildly elevated, but stable. He should continue working on diet and exercise and we can recheck in a year. All of his other labs, including blood sugar are NORMAL.  Algis Greenhouse. Jerline Pain, MD 12/13/2018 11:55 AM

## 2018-12-13 NOTE — Assessment & Plan Note (Signed)
Check CBC, C met, TSH, and lipid panel. 

## 2019-01-06 ENCOUNTER — Other Ambulatory Visit: Payer: Self-pay | Admitting: Family Medicine

## 2019-12-06 ENCOUNTER — Ambulatory Visit: Payer: Managed Care, Other (non HMO) | Attending: Internal Medicine

## 2019-12-06 ENCOUNTER — Other Ambulatory Visit: Payer: Self-pay

## 2019-12-06 DIAGNOSIS — Z23 Encounter for immunization: Secondary | ICD-10-CM

## 2019-12-06 NOTE — Progress Notes (Signed)
° °  Covid-19 Vaccination Clinic  Name:  Thomas Cooley    MRN: [id number] DOB: February 24, 1975  12/06/2019  [name] was observed post Covid-19 immunization for 15 minutes without incident. He was provided with Vaccine Information Sheet and instruction to access the V-Safe system.   [name] was instructed to call 911 with any severe reactions post vaccine:  Difficulty breathing   Swelling of face and throat   A fast heartbeat   A bad rash all over body   Dizziness and weakness

## 2020-01-12 ENCOUNTER — Ambulatory Visit: Payer: Managed Care, Other (non HMO) | Admitting: Family Medicine

## 2020-01-12 ENCOUNTER — Telehealth: Payer: Self-pay

## 2020-01-12 NOTE — Telephone Encounter (Signed)
Nurse Assessment Nurse: Suezanne Jacquet, RN, Riley Lam Date/Time Thomas Cooley Time): 01/12/2020 8:28:44 AM Confirm and document reason for call. If symptomatic, describe symptoms. ---Chest pain radiating into the back and feels like pressure. Not just one side it is around the body and cant take a good deep breath. Does the patient have any new or worsening symptoms? ---Yes Will a triage be completed? ---Yes Related visit to physician within the last 2 weeks? ---No Does the PT have any chronic conditions? (i.e. diabetes, asthma, this includes High risk factors for pregnancy, etc.) ---No Is this a behavioral health or substance abuse call? ---No Guidelines Guideline Title Affirmed Question Affirmed Notes Nurse Date/Time (Eastern Time) Chest Pain [1] Chest pain (or "angina") comes and goes AND [2] is happening more often (increasing in frequency) or getting worse (increasing in severity) (Exception: chest pains that last only a few seconds) Suezanne Jacquet, RN, Riley Lam 01/12/2020 8:30:26 AM Disp. Time Thomas Cooley Time) Disposition Final User 01/12/2020 8:27:17 AM Send to Urgent Queue Donnelly Angelica 01/12/2020 8:33:22 AM Go to ED Now Yes Suezanne Jacquet, RN, Riley Lam PLEASE NOTE: All timestamps contained within this report are represented as [place] Standard Time. CONFIDENTIALTY NOTICE: This fax transmission is intended only for the addressee. It contains information that is legally privileged, confidential or otherwise protected from use or disclosure. If you are not the intended recipient, you are strictly prohibited from reviewing, disclosing, copying using or disseminating any of this information or taking any action in reliance on or regarding this information. If you have received this fax in error, please notify us immediately by telephone so that we can arrange for its return to [place]. Phone: [phone], Toll-Free: [phone], Fax: [phone] Page: 2 of 2 Call Id: [id number] Caller Disagree/Comply Disagree Caller  Understands Yes PreDisposition Did not know what to do Care Advice Given Per Guideline GO TO ED NOW: * Leave now. Drive carefully. NOTHING BY MOUTH: * Do not eat or drink anything for now. * Severe difficulty breathing occurs * Passes out or becomes too weak to stand * You become worse CALL EMS IF: CARE ADVICE given per Chest Pain (Adult) guideline. BRING MEDICINES: ANOTHER ADULT SHOULD DRIVE: Comments User: Cameron Proud, RN Date/Time Thomas Cooley Time): 01/12/2020 8:31:32 AM Rates chest pain 5-6/10 Referrals GO TO FACILITY REFUSED

## 2020-01-12 NOTE — Progress Notes (Deleted)
    Subjective:    CC: Chest / upper back pain  I, Christoper Fabian, LAT, ATC, am serving as scribe for Dr. Clementeen Graham.  HPI: Pt is a 44 y/o male presenting w/ c/o chest pain radiating to her back.    Aggravating factors: Treatments tried:  Pertinent review of Systems: ***  Relevant historical information: ***   Objective:   There were no vitals filed for this visit. General: Well Developed, well nourished, and in no acute distress.   MSK: ***  Lab and Radiology Results No results found for this or any previous visit (from the past 72 hour(s)). No results found.    Impression and Recommendations:    Assessment and Plan: 45 y.o. male with ***.  PDMP not reviewed this encounter. No orders of the defined types were placed in this encounter.  No orders of the defined types were placed in this encounter.   Discussed warning signs or symptoms. Please see discharge instructions. Patient expresses understanding.   ***

## 2020-01-13 ENCOUNTER — Telehealth: Payer: Self-pay | Admitting: *Deleted

## 2020-01-13 ENCOUNTER — Ambulatory Visit: Payer: Managed Care, Other (non HMO) | Admitting: Family Medicine

## 2020-01-13 NOTE — Telephone Encounter (Signed)
Patient states he has scheduled an appointment with sports medicine tomorrow, did not understand the triage nurse when she called and says does not feel like he needs to go to ED.

## 2020-01-13 NOTE — Telephone Encounter (Signed)
Please schedule f/u ov for this for pt.

## 2020-01-13 NOTE — Telephone Encounter (Signed)
Call patient to check on him  Stated has appointment tomorrow   has pain around rib cage x2 years  Pain on and off, worsen with exercise.  Pain radiating from chest area to back. No pain today

## 2020-01-13 NOTE — Telephone Encounter (Signed)
Patient is scheduled for [facility] tomorrow

## 2020-01-13 NOTE — Telephone Encounter (Signed)
Sports meds sent a teams and the appointment is not sports meds related so pt needs to be scheduled with a regular provider not sports meds.

## 2020-01-14 ENCOUNTER — Ambulatory Visit: Payer: Managed Care, Other (non HMO) | Admitting: Family Medicine

## 2020-01-14 ENCOUNTER — Ambulatory Visit: Payer: Managed Care, Other (non HMO) | Admitting: Physician Assistant

## 2020-01-14 ENCOUNTER — Telehealth: Payer: Self-pay

## 2020-01-14 NOTE — Telephone Encounter (Signed)
Patient is requesting to be worked in in December for a CPE please advise

## 2020-01-19 ENCOUNTER — Other Ambulatory Visit: Payer: Self-pay

## 2020-01-19 ENCOUNTER — Encounter: Payer: Self-pay | Admitting: Physician Assistant

## 2020-01-19 ENCOUNTER — Ambulatory Visit: Payer: Managed Care, Other (non HMO) | Admitting: Physician Assistant

## 2020-01-19 VITALS — BP 120/78 | HR 80 | Temp 98.4°F | Ht 69.0 in | Wt 182.2 lb

## 2020-01-19 DIAGNOSIS — R0789 Other chest pain: Secondary | ICD-10-CM | POA: Diagnosis not present

## 2020-01-19 DIAGNOSIS — R1013 Epigastric pain: Secondary | ICD-10-CM | POA: Diagnosis not present

## 2020-01-19 MED ORDER — DICLOFENAC SODIUM 75 MG PO TBEC: 75.0000 mg | DELAYED_RELEASE_TABLET | Freq: Two times a day (BID) | ORAL | 0 refills | Status: DC

## 2020-01-19 MED ORDER — PANTOPRAZOLE SODIUM 40 MG PO TBEC: 40.0000 mg | DELAYED_RELEASE_TABLET | Freq: Every day | ORAL | 0 refills | Status: DC

## 2020-01-19 NOTE — Progress Notes (Signed)
Thomas Cooley is a 44 y.o. male here for a new problem.  I acted as a Neurosurgeon for [employer], PA-C Corky Mull, LPN   History of Present Illness:   Chief Complaint  Patient presents with  . Abdominal Pain    HPI   Abdominal pain Pt c/o upper abdominal pain radiating all the way around his back. Symptoms x 10-14 days. Pt is having a lot of gas, bloating and burping. This is also associated with esophageal burning. Has tried Omeprazole OTC with little relief. Also has tried Print production planner. He took oral diclofenac and he felt like this relieved his symptoms.  Club soda made it worse. Eats one spicy meal a day and this worsens his symptoms. Does not get chest pain with activity.  Constipation recently. Did have some episodes of slightly firmer stools this morning.  Denies: rectal bleeding, dizziness, lightheadedness, SOB, chest pain with activity, significant family hx of heart disease, hematuria, hx of kidney stones.  He reports that he gets this "abdominal pain radiating around his back" every 3-4 years. Goes away on its own, never worked up in the past.  Past Medical History:  Diagnosis Date  . Hyperlipidemia   . Internal hemorrhoid, bleeding   . Vitamin D deficiency      Social History   Tobacco Use  . Smoking status: Never Smoker  . Smokeless tobacco: Never Used  Vaping Use  . Vaping Use: Never used  Substance Use Topics  . Alcohol use: Yes    Comment: Social   . Drug use: No    Past Surgical History:  Procedure Laterality Date  . NASAL SINUS SURGERY      Family History  Problem Relation Age of Onset  . Diabetes Mother   . Anal fissures Mother   . Diabetes Father   . Kidney failure Father   . Anal fissures Father     Allergies  Allergen Reactions  . Floraquin [Iodoquinol] Rash    Current Medications:   Current Outpatient Medications:  .  diclofenac (VOLTAREN) 75 MG EC tablet, Take 1 tablet (75 mg total) by mouth 2 (two) times daily., Disp: 30  tablet, Rfl: 0 .  pantoprazole (PROTONIX) 40 MG tablet, Take 1 tablet (40 mg total) by mouth daily., Disp: 30 tablet, Rfl: 0   Review of Systems:   ROS  Negative unless otherwise specified per HPI.  Vitals:   Vitals:   01/19/20 1541  BP: 120/78  Pulse: 80  Temp: 98.4 F (36.9 C)  TempSrc: Temporal  SpO2: 96%  Weight: 182 lb 4 oz (82.7 kg)  Height: 5\' 9"  (1.753 m)     Body mass index is 26.91 kg/m.  Physical Exam:   Physical Exam HENT:     Head: Normocephalic and atraumatic.  Cardiovascular:     Rate and Rhythm: Normal rate and regular rhythm.  Pulmonary:     Effort: Pulmonary effort is normal.     Breath sounds: Normal breath sounds.  Abdominal:     General: Abdomen is flat. Bowel sounds are normal.     Palpations: Abdomen is soft.     Tenderness: There is no abdominal tenderness.  Neurological:     Mental Status: He is alert.       Assessment and Plan:   [name] was seen today for abdominal pain.  Diagnoses and all orders for this visit:  Epigastric and chest wall pain EKG tracing is personally reviewed.  EKG notes NSR.  No acute changes.  No red  flags on exam. DDx include: gastritis, GERD, esophagitis, costochondritis, gas, IBS, PUD, among others. Will stop carafate and trial oral protonix. Reviewed GERD-friendly diet, recommend avoiding possible triggers, especially spicy foods. Diclofenac prn. Follow-up with PCP in 1-2 weeks if symptoms persist. Call us if symptoms worsen/change. -     EKG 12-Lead -     Comprehensive metabolic panel; Future -     CBC with Differential/Platelet; Future -     TSH; Future -     Lipase; Future -     Lipase -     TSH -     CBC with Differential/Platelet -     Comprehensive metabolic panel  Other orders -     pantoprazole (PROTONIX) 40 MG tablet; Take 1 tablet (40 mg total) by mouth daily. -     diclofenac (VOLTAREN) 75 MG EC tablet; Take 1 tablet (75 mg total) by mouth 2 (two) times daily.   CMA or LPN  served as scribe during this visit. History, Physical, and Plan performed by medical provider. The above documentation has been reviewed and is accurate and complete.   Jarold Motto, PA-C

## 2020-01-19 NOTE — Patient Instructions (Addendum)
Possible costochondritis and uncontrolled heartburn.  Take diclofenac as needed.  Stop prilosec and start protonix (this is a prescription.)  Update your blood work today and try to follow reduced acid diet, see below.  Follow-up with Dr. Jimmey Ralph in 1-2 weeks, sooner if concerns, if symptoms persist. Call us in the meantime if they worsen.  Food Choices for Gastroesophageal Reflux Disease, Adult When you have gastroesophageal reflux disease (GERD), the foods you eat and your eating habits are very important. Choosing the right foods can help ease your discomfort. Think about working with a nutrition specialist (dietitian) to help you make good choices. What are tips for following this plan?  Meals  Choose healthy foods that are low in fat, such as fruits, vegetables, whole grains, low-fat dairy products, and lean meat, fish, and poultry.  Eat small meals often instead of 3 large meals a day. Eat your meals slowly, and in a place where you are relaxed. Avoid bending over or lying down until 2-3 hours after eating.  Avoid eating meals 2-3 hours before bed.  Avoid drinking a lot of liquid with meals.  Cook foods using methods other than frying. Bake, grill, or broil food instead.  Avoid or limit: ? Chocolate. ? Peppermint or spearmint. ? Alcohol. ? Pepper. ? Black and decaffeinated coffee. ? Black and decaffeinated tea. ? Bubbly (carbonated) soft drinks. ? Caffeinated energy drinks and soft drinks.  Limit high-fat foods such as: ? Fatty meat or fried foods. ? Whole milk, cream, butter, or ice cream. ? Nuts and nut butters. ? Pastries, donuts, and sweets made with butter or shortening.  Avoid foods that cause symptoms. These foods may be different for everyone. Common foods that cause symptoms include: ? Tomatoes. ? Oranges, lemons, and limes. ? Peppers. ? Spicy food. ? Onions and garlic. ? Vinegar. Summary  When you have gastroesophageal reflux disease (GERD), food and  lifestyle choices are very important in easing your symptoms.  Eat small meals often instead of 3 large meals a day. Eat your meals slowly, and in a place where you are relaxed.  Limit high-fat foods such as fatty meat or fried foods.  Avoid bending over or lying down until 2-3 hours after eating.  Avoid peppermint and spearmint, caffeine, alcohol, and chocolate.   Costochondritis Costochondritis is swelling and irritation (inflammation) of the tissue (cartilage) that connects your ribs to your breastbone (sternum). This causes pain in the front of your chest. Usually, the pain:  Starts gradually.  Is in more than one rib. This condition usually goes away on its own over time. Follow these instructions at home:  Do not do anything that makes your pain worse.  If directed, put ice on the painful area: ? Put ice in a plastic bag. ? Place a towel between your skin and the bag. ? Leave the ice on for 20 minutes, 2-3 times a day.  If directed, put heat on the affected area as often as told by your doctor. Use the heat source that your doctor tells you to use, such as a moist heat pack or a heating pad. ? Place a towel between your skin and the heat source. ? Leave the heat on for 20-30 minutes. ? Take off the heat if your skin turns bright red. This is very important if you cannot feel pain, heat, or cold. You may have a greater risk of getting burned.  Take over-the-counter and prescription medicines only as told by your doctor.  Return to your  normal activities as told by your doctor. Ask your doctor what activities are safe for you.  Keep all follow-up visits as told by your doctor. This is important. Contact a doctor if:  You have chills or a fever.  Your pain does not go away or it gets worse.  You have a cough that does not go away. Get help right away if:  You are short of breath. This information is not intended to replace advice given to you by your health care  provider. Make sure you discuss any questions you have with your health care provider. Document Revised: 02/21/2017 Document Reviewed: 06/02/2015 Elsevier Patient Education  2020 [employer].

## 2020-01-20 ENCOUNTER — Other Ambulatory Visit: Payer: Self-pay | Admitting: Physician Assistant

## 2020-01-20 ENCOUNTER — Ambulatory Visit (HOSPITAL_COMMUNITY): Payer: Managed Care, Other (non HMO)

## 2020-01-20 ENCOUNTER — Other Ambulatory Visit: Payer: Self-pay | Admitting: Family Medicine

## 2020-01-20 DIAGNOSIS — R1013 Epigastric pain: Secondary | ICD-10-CM

## 2020-01-20 DIAGNOSIS — K859 Acute pancreatitis without necrosis or infection, unspecified: Secondary | ICD-10-CM

## 2020-01-20 LAB — CBC WITH DIFFERENTIAL/PLATELET
Absolute Monocytes: 450 cells/uL (ref 200–950)
Basophils Absolute: 30 cells/uL (ref 0–200)
Basophils Relative: 0.5 %
Eosinophils Absolute: 168 cells/uL (ref 15–500)
Eosinophils Relative: 2.8 %
HCT: 44.5 % (ref 38.5–50.0)
Hemoglobin: 15 g/dL (ref 13.2–17.1)
Lymphs Abs: 1368 cells/uL (ref 850–3900)
MCH: 30.2 pg (ref 27.0–33.0)
MCHC: 33.7 g/dL (ref 32.0–36.0)
MCV: 89.5 fL (ref 80.0–100.0)
MPV: 11 fL (ref 7.5–12.5)
Monocytes Relative: 7.5 %
Neutro Abs: 3984 cells/uL (ref 1500–7800)
Neutrophils Relative %: 66.4 %
Platelets: 217 10*3/uL (ref 140–400)
RBC: 4.97 10*6/uL (ref 4.20–5.80)
RDW: 12.5 % (ref 11.0–15.0)
Total Lymphocyte: 22.8 %
WBC: 6 10*3/uL (ref 3.8–10.8)

## 2020-01-20 LAB — COMPREHENSIVE METABOLIC PANEL
AG Ratio: 1.7 (calc) (ref 1.0–2.5)
ALT: 39 U/L (ref 9–46)
AST: 22 U/L (ref 10–40)
Albumin: 4.8 g/dL (ref 3.6–5.1)
Alkaline phosphatase (APISO): 50 U/L (ref 36–130)
BUN: 13 mg/dL (ref 7–25)
CO2: 26 mmol/L (ref 20–32)
Calcium: 10 mg/dL (ref 8.6–10.3)
Chloride: 104 mmol/L (ref 98–110)
Creat: 0.84 mg/dL (ref 0.60–1.35)
Globulin: 2.9 g/dL (calc) (ref 1.9–3.7)
Glucose, Bld: 113 mg/dL — ABNORMAL HIGH (ref 65–99)
Potassium: 4.5 mmol/L (ref 3.5–5.3)
Sodium: 139 mmol/L (ref 135–146)
Total Bilirubin: 0.7 mg/dL (ref 0.2–1.2)
Total Protein: 7.7 g/dL (ref 6.1–8.1)

## 2020-01-20 LAB — LIPASE: Lipase: 137 U/L — ABNORMAL HIGH (ref 7–60)

## 2020-01-20 LAB — TSH: TSH: 2.26 mIU/L (ref 0.40–4.50)

## 2020-01-21 ENCOUNTER — Other Ambulatory Visit: Payer: Self-pay

## 2020-01-21 ENCOUNTER — Ambulatory Visit (HOSPITAL_COMMUNITY)
Admission: RE | Admit: 2020-01-21 | Discharge: 2020-01-21 | Disposition: A | Discharge status: Home / Self Care | Payer: Managed Care, Other (non HMO) | Source: Ambulatory Visit | Attending: Physician Assistant | Admitting: Physician Assistant

## 2020-01-21 DIAGNOSIS — K859 Acute pancreatitis without necrosis or infection, unspecified: Secondary | ICD-10-CM | POA: Insufficient documentation

## 2020-01-21 MED ORDER — IOHEXOL 300 MG/ML  SOLN
100.0000 mL | Freq: Once | INTRAMUSCULAR | Status: AC | PRN
  Administered 2020-01-21: 100 mL via INTRAVENOUS

## 2020-01-22 ENCOUNTER — Telehealth: Payer: Self-pay

## 2020-01-22 NOTE — Telephone Encounter (Signed)
Pt called about CT results. Pt wants to know why the pain is there and what his next steps should be. Please advise.

## 2020-01-22 NOTE — Telephone Encounter (Signed)
See result note. Patient notified

## 2020-01-26 NOTE — Progress Notes (Signed)
Patient has been scheduled

## 2020-01-29 ENCOUNTER — Ambulatory Visit: Payer: Managed Care, Other (non HMO) | Admitting: Family Medicine

## 2020-02-03 ENCOUNTER — Encounter: Payer: Self-pay | Admitting: Family Medicine

## 2020-02-03 ENCOUNTER — Ambulatory Visit: Payer: Managed Care, Other (non HMO) | Admitting: Family Medicine

## 2020-02-03 ENCOUNTER — Other Ambulatory Visit: Payer: Self-pay

## 2020-02-03 VITALS — BP 108/72 | HR 71 | Temp 98.2°F | Ht 69.0 in | Wt 187.8 lb

## 2020-02-03 DIAGNOSIS — K5900 Constipation, unspecified: Secondary | ICD-10-CM | POA: Diagnosis not present

## 2020-02-03 MED ORDER — POLYETHYLENE GLYCOL 3350 17 GM/SCOOP PO POWD: 17.0000 g | Freq: Two times a day (BID) | ORAL | 1 refills | Status: DC | PRN

## 2020-02-03 NOTE — Patient Instructions (Signed)
It was very nice to see you today!  Please take your MiraLAX as much as needed to have 1-2 soft bowel movements daily for the next 1 to 2 weeks.  Let me know if not improving.  I will see you back in a couple weeks for your annual physical.  Please come back to see me sooner if needed.  Take care, Dr Jimmey Ralph  Please try these tips to maintain a healthy lifestyle:   Eat at least 3 REAL meals and 1-2 snacks per day.  Aim for no more than 5 hours between eating.  If you eat breakfast, please do so within one hour of getting up.    Each meal should contain half fruits/vegetables, one quarter protein, and one quarter carbs (no bigger than a computer mouse)   Cut down on sweet beverages. This includes juice, soda, and sweet tea.     Drink at least 1 glass of water with each meal and aim for at least 8 glasses per day   Exercise at least 150 minutes every week.

## 2020-02-03 NOTE — Progress Notes (Signed)
   Thomas Cooley is a 44 y.o. male who presents today for an office visit.  Assessment/Plan:  Abdominal Pain Likely secondary to constipation based on history.  CT scan also shows moderate stool burden based on my read.  We will start a bowel cleanout with MiraLAX as needed to have 1 to 2 soft bowel movements daily for the next 1 to 3 weeks.  Possibly PUD though less likely given that symptoms are not improved or worsened by food.  Musculoskeletal etiology is also possible.  Would consider back plain film or referral to sports medicine if above is negative.  Discussed reasons to return to care.    Subjective:  HPI:  Patient here for follow-up on abdominal pain.  He was seen 2 weeks ago for different provider here.  At that time was having symptoms for 10 to 14 days.  Had upper abdominal pain radiating into his back.  EKG was normal.  CT scan showed no acute abnormality.  Symptoms have been relatively stable.  Still has persistent upper abdominal pain.  Symptoms come and go without any obvious aggravating or alleviating factors.  No association with food.  No nausea or vomiting.  Has intermittent constipation.       Objective:  Physical Exam: Temp 98.2 F (36.8 C) (Temporal)   Ht 5\' 9"  (1.753 m)   Wt 187 lb 12.8 oz (85.2 kg)   BMI 27.73 kg/m   Gen: No acute distress, resting comfortably Neuro: Grossly normal, moves all extremities Psych: Normal affect and thought content      Beatriz Quintela M. , MD 02/03/2020 11:34 AM

## 2020-02-16 ENCOUNTER — Other Ambulatory Visit: Payer: Self-pay

## 2020-02-16 ENCOUNTER — Ambulatory Visit (INDEPENDENT_AMBULATORY_CARE_PROVIDER_SITE_OTHER): Payer: Managed Care, Other (non HMO) | Admitting: Family Medicine

## 2020-02-16 ENCOUNTER — Encounter: Payer: Self-pay | Admitting: Family Medicine

## 2020-02-16 VITALS — BP 118/60 | HR 61 | Temp 97.8°F | Resp 18 | Ht 69.0 in | Wt 188.0 lb

## 2020-02-16 DIAGNOSIS — Z0001 Encounter for general adult medical examination with abnormal findings: Secondary | ICD-10-CM | POA: Diagnosis not present

## 2020-02-16 DIAGNOSIS — E785 Hyperlipidemia, unspecified: Secondary | ICD-10-CM

## 2020-02-16 DIAGNOSIS — M47816 Spondylosis without myelopathy or radiculopathy, lumbar region: Secondary | ICD-10-CM

## 2020-02-16 DIAGNOSIS — E663 Overweight: Secondary | ICD-10-CM

## 2020-02-16 DIAGNOSIS — Z6827 Body mass index (BMI) 27.0-27.9, adult: Secondary | ICD-10-CM

## 2020-02-16 DIAGNOSIS — R739 Hyperglycemia, unspecified: Secondary | ICD-10-CM

## 2020-02-16 DIAGNOSIS — E559 Vitamin D deficiency, unspecified: Secondary | ICD-10-CM

## 2020-02-16 LAB — COMPREHENSIVE METABOLIC PANEL
ALT: 32 U/L (ref 0–53)
AST: 21 U/L (ref 0–37)
Albumin: 4.8 g/dL (ref 3.5–5.2)
Alkaline Phosphatase: 48 U/L (ref 39–117)
BUN: 16 mg/dL (ref 6–23)
CO2: 26 mEq/L (ref 19–32)
Calcium: 9.8 mg/dL (ref 8.4–10.5)
Chloride: 102 mEq/L (ref 96–112)
Creatinine, Ser: 1.05 mg/dL (ref 0.40–1.50)
GFR: 86.21 mL/min (ref >60.00)
Glucose, Bld: 93 mg/dL (ref 70–99)
Potassium: 4.1 mEq/L (ref 3.5–5.1)
Sodium: 137 mEq/L (ref 135–145)
Total Bilirubin: 0.7 mg/dL (ref 0.2–1.2)
Total Protein: 7.8 g/dL (ref 6.0–8.3)

## 2020-02-16 LAB — CBC
HCT: 44.1 % (ref 39.0–52.0)
Hemoglobin: 15.1 g/dL (ref 13.0–17.0)
MCHC: 34.2 g/dL (ref 30.0–36.0)
MCV: 87.9 fl (ref 78.0–100.0)
Platelets: 207 10*3/uL (ref 150.0–400.0)
RBC: 5.01 Mil/uL (ref 4.22–5.81)
RDW: 13 % (ref 11.5–15.5)
WBC: 4.4 10*3/uL (ref 4.0–10.5)

## 2020-02-16 LAB — LIPID PANEL
Cholesterol: 204 mg/dL — ABNORMAL HIGH (ref 0–200)
HDL: 35.5 mg/dL — ABNORMAL LOW (ref >39.00)
LDL Cholesterol: 137 mg/dL — ABNORMAL HIGH (ref 0–99)
NonHDL: 168.83
Total CHOL/HDL Ratio: 6
Triglycerides: 161 mg/dL — ABNORMAL HIGH (ref 0.0–149.0)
VLDL: 32.2 mg/dL (ref 0.0–40.0)

## 2020-02-16 LAB — TSH: TSH: 1.96 u[IU]/mL (ref 0.35–4.50)

## 2020-02-16 LAB — VITAMIN D 25 HYDROXY (VIT D DEFICIENCY, FRACTURES): VITD: 28.01 ng/mL — ABNORMAL LOW (ref 30.00–100.00)

## 2020-02-16 LAB — HEMOGLOBIN A1C: Hgb A1c MFr Bld: 5.4 % (ref 4.6–6.5)

## 2020-02-16 NOTE — Assessment & Plan Note (Signed)
Is currently working on home exercise symptoms overall stable.  Takes diclofenac as needed.  If no improvement would consider referral to sports medicine.

## 2020-02-16 NOTE — Progress Notes (Signed)
Chief Complaint:  Thomas Cooley is a 44 y.o. male who presents today for his annual comprehensive physical exam.    Assessment/Plan:  New/Acute Problems: Abdominal Pain Slightly better since last visit.  He will continue using Thomas Cooley.  May be having referred pain from her back as well.  We will continue current treatment plan and he will let me know if not improving.  Chronic Problems Addressed Today: Lumbar spondylosis Is currently working on home exercise symptoms overall stable.  Takes Thomas Cooley as needed.  If no improvement would consider referral to sports medicine.  Hyperglycemia Check A1c.  Dyslipidemia Check lipids.  Vitamin D deficiency Check vitamin D.   Body mass index is 27.76 kg/m. / Overweight  BMI Metric Follow Up - 02/16/20 1012      BMI Metric Follow Up-Please document annually   BMI Metric Follow Up Education provided            Preventative Healthcare: Due for colon cancer screening next year.  Check CBC, CMET, TSH, lipid panel.  Up-to-date on flu and Covid vaccine.  Patient Counseling(The following topics were reviewed and/or handout was given):  -Nutrition: Stressed importance of moderation in sodium/caffeine intake, saturated fat and cholesterol, caloric balance, sufficient intake of fresh fruits, vegetables, and fiber.  -Stressed the importance of regular exercise.   -Substance Abuse: Discussed cessation/primary prevention of tobacco, alcohol, or other drug use; driving or other dangerous activities under the influence; availability of treatment for abuse.   -Injury prevention: Discussed safety belts, safety helmets, smoke detector, smoking near bedding or upholstery.   -Sexuality: Discussed sexually transmitted diseases, partner selection, use of condoms, avoidance of unintended pregnancy and contraceptive alternatives.   -Dental health: Discussed importance of regular tooth brushing, flossing, and dental visits.  -Health maintenance and  immunizations reviewed. Please refer to Health maintenance section.  Return to care in 1 year for next preventative visit.     Subjective:  HPI:  He has no acute complaints today.   He was seen a couple of weeks ago for abdominal pain and back pain.  CT scan showed some stool burden but otherwise no acute abnormalities.  Was also noted to have mild spondylosis of lumbar and thoracic spine at that time.  Since her last visit he has been using Thomas Cooley which is helping with the constipation.  He is now having a soft bowel movement daily.  Is also been taking Thomas Cooley which helps with the pain.  Lifestyle Diet: None specific.  Exercise: Yoga and treadmill 4 times per week.   Depression screen PHQ 2/9 02/03/2020  Decreased Interest 0  Down, Depressed, Hopeless 0  PHQ - 2 Score 0    Health Maintenance Due  Topic Date Due  . Hepatitis C Screening  Never done  . COVID-19 Vaccine (2 - Pfizer 2-dose series) 12/27/2019     ROS: Per HPI, otherwise a complete review of systems was negative.   PMH:  The following were reviewed and entered/updated in epic: Past Medical History:  Diagnosis Date  . Hyperlipidemia   . Internal hemorrhoid, bleeding   . Vitamin D deficiency    Patient Active Problem List   Diagnosis Date Noted  . Lumbar spondylosis 02/16/2020  . Hyperglycemia 12/13/2018  . Dyslipidemia 04/06/2017  . Vitamin D deficiency 03/27/2016  . NAFL (nonalcoholic fatty liver) 03/27/2016   Past Surgical History:  Procedure Laterality Date  . NASAL SINUS SURGERY      Family History  Problem Relation Age of Onset  . Diabetes  Mother   . Anal fissures Mother   . Diabetes Father   . Kidney failure Father   . Anal fissures Father     Medications- reviewed and updated Current Outpatient Medications  Medication Sig Dispense Refill  . Thomas Cooley (VOLTAREN) 75 MG EC tablet Take 1 tablet (75 mg total) by mouth 2 (two) times daily. 30 tablet 0  . pantoprazole (PROTONIX) 40 MG  tablet Take 1 tablet (40 mg total) by mouth daily. 30 tablet 0  . polyethylene glycol powder (GLYCOLAX/Thomas Cooley) 17 GM/SCOOP powder Take 17 g by mouth 2 (two) times daily as needed. 3350 g 1   No current facility-administered medications for this visit.    Allergies-reviewed and updated Allergies  Allergen Reactions  . Thomas Cooley [Iodoquinol] Rash    Social History   Socioeconomic History  . Marital status: Married    Spouse name: Not on file  . Number of children: 2  . Years of education: Not on file  . Highest education level: Not on file  Occupational History  . 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 Smoking status: Never Smoker  . Smokeless tobacco: Never Used  Vaping Use  . Vaping Use: Never used  Substance and Sexual Activity  . Alcohol use: Yes    Comment: Social   . Drug use: No  . Sexual activity: Yes    Partners: Female  Other Topics Concern  . Not on file  Social History Narrative  . Not on file   Social Determinants of Health   Financial Resource Strain: Not on file  Food Insecurity: Not on file  Transportation Needs: Not on file  Physical Activity: Not on file  Stress: Not on file  Social Connections: Not on file        Objective:  Physical Exam: BP 118/60   Pulse 61   Temp 97.8 F (36.6 C) (Temporal)   Resp 18   Ht 5\' 9"  (1.753 m)   Wt 188 lb (85.3 kg)   SpO2 99%   BMI 27.76 kg/m   Body mass index is 27.76 kg/m. Wt Readings from Last 3 Encounters:  02/16/20 188 lb (85.3 kg)  02/03/20 187 lb 12.8 oz (85.2 kg)  01/19/20 182 lb 4 oz (82.7 kg)   Gen: NAD, resting comfortably HEENT: TMs normal bilaterally. OP clear. No thyromegaly noted.  CV: RRR with no murmurs appreciated Pulm: NWOB, CTAB with no crackles, wheezes, or rhonchi GI: Normal bowel sounds present. Soft, Nontender, Nondistended. MSK: no edema, cyanosis, or clubbing noted Skin: warm, dry Neuro: CN2-12 grossly intact. Strength 5/5 in upper and lower extremities.  Reflexes symmetric and intact bilaterally.  Psych: Normal affect and thought content     Thomas Cooley M. 01/21/20, MD 02/16/2020 10:14 AM

## 2020-02-16 NOTE — Patient Instructions (Signed)
It was very nice to see you today!  We will check blood work today.  Please continue using the MiraLAX and diclofenac.  Let me know if not improving in the next couple of weeks.  I will see you back in a year for your next physical. Please come back sooner to see me if needed.  Take care, Dr Jerline Pain  Please try these tips to maintain a healthy lifestyle:   Eat at least 3 REAL meals and 1-2 snacks per day.  Aim for no more than 5 hours between eating.  If you eat breakfast, please do so within one hour of getting up.    Each meal should contain half fruits/vegetables, one quarter protein, and one quarter carbs (no bigger than a computer mouse)   Cut down on sweet beverages. This includes juice, soda, and sweet tea.     Drink at least 1 glass of water with each meal and aim for at least 8 glasses per day   Exercise at least 150 minutes every week.    Preventive Care 62-40 Years Old, Male Preventive care refers to lifestyle choices and visits with your health care provider that can promote health and wellness. This includes:  A yearly physical exam. This is also called an annual well check.  Regular dental and eye exams.  Immunizations.  Screening for certain conditions.  Healthy lifestyle choices, such as eating a healthy diet, getting regular exercise, not using drugs or products that contain nicotine and tobacco, and limiting alcohol use. What can I expect for my preventive care visit? Physical exam Your health care provider will check:  Height and weight. These may be used to calculate body mass index (BMI), which is a measurement that tells if you are at a healthy weight.  Heart rate and blood pressure.  Your skin for abnormal spots. Counseling Your health care provider may ask you questions about:  Alcohol, tobacco, and drug use.  Emotional well-being.  Home and relationship well-being.  Sexual activity.  Eating habits.  Work and work  Statistician. What immunizations do I need?  Influenza (flu) vaccine  This is recommended every year. Tetanus, diphtheria, and pertussis (Tdap) vaccine  You may need a Td booster every 10 years. Varicella (chickenpox) vaccine  You may need this vaccine if you have not already been vaccinated. Zoster (shingles) vaccine  You may need this after age 55. Measles, mumps, and rubella (MMR) vaccine  You may need at least one dose of MMR if you were born in 1957 or later. You may also need a second dose. Pneumococcal conjugate (PCV13) vaccine  You may need this if you have certain conditions and were not previously vaccinated. Pneumococcal polysaccharide (PPSV23) vaccine  You may need one or two doses if you smoke cigarettes or if you have certain conditions. Meningococcal conjugate (MenACWY) vaccine  You may need this if you have certain conditions. Hepatitis A vaccine  You may need this if you have certain conditions or if you travel or work in places where you may be exposed to hepatitis A. Hepatitis B vaccine  You may need this if you have certain conditions or if you travel or work in places where you may be exposed to hepatitis B. Haemophilus influenzae type b (Hib) vaccine  You may need this if you have certain risk factors. Human papillomavirus (HPV) vaccine  If recommended by your health care provider, you may need three doses over 6 months. You may receive vaccines as individual doses or  as more than one vaccine together in one shot (combination vaccines). Talk with your health care provider about the risks and benefits of combination vaccines. What tests do I need? Blood tests  Lipid and cholesterol levels. These may be checked every 5 years, or more frequently if you are over 90 years old.  Hepatitis C test.  Hepatitis B test. Screening  Lung cancer screening. You may have this screening every year starting at age 71 if you have a 30-pack-year history of smoking  and currently smoke or have quit within the past 15 years.  Prostate cancer screening. Recommendations will vary depending on your family history and other risks.  Colorectal cancer screening. All adults should have this screening starting at age 28 and continuing until age 64. Your health care provider may recommend screening at age 25 if you are at increased risk. You will have tests every 1-10 years, depending on your results and the type of screening test.  Diabetes screening. This is done by checking your blood sugar (glucose) after you have not eaten for a while (fasting). You may have this done every 1-3 years.  Sexually transmitted disease (STD) testing. Follow these instructions at home: Eating and drinking  Eat a diet that includes fresh fruits and vegetables, whole grains, lean protein, and low-fat dairy products.  Take vitamin and mineral supplements as recommended by your health care provider.  Do not drink alcohol if your health care provider tells you not to drink.  If you drink alcohol: ? Limit how much you have to 0-2 drinks a day. ? Be aware of how much alcohol is in your drink. In the [place] one drink equals one 12 oz bottle of beer (355 mL), one 5 oz glass of wine (148 mL), or one 1 oz glass of hard liquor (44 mL). Lifestyle  Take daily care of your teeth and gums.  Stay active. Exercise for at least 30 minutes on 5 or more days each week.  Do not use any products that contain nicotine or tobacco, such as cigarettes, e-cigarettes, and chewing tobacco. If you need help quitting, ask your health care provider.  If you are sexually active, practice safe sex. Use a condom or other form of protection to prevent STIs (sexually transmitted infections).  Talk with your health care provider about taking a low-dose aspirin every day starting at age 71. What's next?  Go to your health care provider once a year for a well check visit.  Ask your health care provider how  often you should have your eyes and teeth checked.  Stay up to date on all vaccines. This information is not intended to replace advice given to you by your health care provider. Make sure you discuss any questions you have with your health care provider. Document Revised: 01/31/2018 Document Reviewed: 01/31/2018 Elsevier Patient Education  2020 [employer].

## 2020-02-16 NOTE — Assessment & Plan Note (Signed)
Check A1c. 

## 2020-02-16 NOTE — Assessment & Plan Note (Signed)
Check lipids 

## 2020-02-16 NOTE — Assessment & Plan Note (Signed)
Check vitamin D. 

## 2020-02-17 NOTE — Progress Notes (Signed)
Please inform patient of the following:  Cholesterol is borderline and vitamin D slightly low but overall labs are stable. Do not need to make any changes to his treatment plan at this time. Would like for him to keep working on diet and exercise and we can recheck in a year.  Katina Degree. Jimmey Ralph, MD 02/17/2020 8:38 AM

## 2020-02-18 ENCOUNTER — Other Ambulatory Visit: Payer: Self-pay | Admitting: Physician Assistant

## 2020-03-03 ENCOUNTER — Other Ambulatory Visit: Payer: Self-pay

## 2020-03-03 ENCOUNTER — Other Ambulatory Visit: Payer: Self-pay | Admitting: Family Medicine

## 2020-03-03 MED ORDER — PANTOPRAZOLE SODIUM 40 MG PO TBEC: 40.0000 mg | DELAYED_RELEASE_TABLET | Freq: Every day | ORAL | 0 refills | Status: DC

## 2021-02-16 ENCOUNTER — Other Ambulatory Visit: Payer: Self-pay

## 2021-02-16 ENCOUNTER — Ambulatory Visit (INDEPENDENT_AMBULATORY_CARE_PROVIDER_SITE_OTHER): Payer: Managed Care, Other (non HMO) | Admitting: Family Medicine

## 2021-02-16 VITALS — BP 118/80 | HR 71 | Temp 98.3°F | Ht 69.0 in | Wt 190.2 lb

## 2021-02-16 DIAGNOSIS — Z6828 Body mass index (BMI) 28.0-28.9, adult: Secondary | ICD-10-CM

## 2021-02-16 DIAGNOSIS — R739 Hyperglycemia, unspecified: Secondary | ICD-10-CM | POA: Diagnosis not present

## 2021-02-16 DIAGNOSIS — E559 Vitamin D deficiency, unspecified: Secondary | ICD-10-CM | POA: Diagnosis not present

## 2021-02-16 DIAGNOSIS — E785 Hyperlipidemia, unspecified: Secondary | ICD-10-CM | POA: Diagnosis not present

## 2021-02-16 DIAGNOSIS — Z1211 Encounter for screening for malignant neoplasm of colon: Secondary | ICD-10-CM

## 2021-02-16 DIAGNOSIS — Z0001 Encounter for general adult medical examination with abnormal findings: Secondary | ICD-10-CM

## 2021-02-16 LAB — LIPID PANEL
Cholesterol: 235 mg/dL — ABNORMAL HIGH (ref 0–200)
HDL: 35.8 mg/dL — ABNORMAL LOW (ref >39.00)
LDL Cholesterol: 169 mg/dL — ABNORMAL HIGH (ref 0–99)
NonHDL: 199.44
Total CHOL/HDL Ratio: 7
Triglycerides: 151 mg/dL — ABNORMAL HIGH (ref 0.0–149.0)
VLDL: 30.2 mg/dL (ref 0.0–40.0)

## 2021-02-16 LAB — COMPREHENSIVE METABOLIC PANEL
ALT: 21 U/L (ref 0–53)
AST: 20 U/L (ref 0–37)
Albumin: 4.6 g/dL (ref 3.5–5.2)
Alkaline Phosphatase: 47 U/L (ref 39–117)
BUN: 13 mg/dL (ref 6–23)
CO2: 25 mEq/L (ref 19–32)
Calcium: 9.7 mg/dL (ref 8.4–10.5)
Chloride: 103 mEq/L (ref 96–112)
Creatinine, Ser: 0.98 mg/dL (ref 0.40–1.50)
GFR: 92.99 mL/min (ref >60.00)
Glucose, Bld: 93 mg/dL (ref 70–99)
Potassium: 4.6 mEq/L (ref 3.5–5.1)
Sodium: 138 mEq/L (ref 135–145)
Total Bilirubin: 0.7 mg/dL (ref 0.2–1.2)
Total Protein: 7.5 g/dL (ref 6.0–8.3)

## 2021-02-16 LAB — CBC
HCT: 45 % (ref 39.0–52.0)
Hemoglobin: 15.2 g/dL (ref 13.0–17.0)
MCHC: 33.8 g/dL (ref 30.0–36.0)
MCV: 88.8 fl (ref 78.0–100.0)
Platelets: 222 10*3/uL (ref 150.0–400.0)
RBC: 5.07 Mil/uL (ref 4.22–5.81)
RDW: 13.2 % (ref 11.5–15.5)
WBC: 4.7 10*3/uL (ref 4.0–10.5)

## 2021-02-16 LAB — VITAMIN D 25 HYDROXY (VIT D DEFICIENCY, FRACTURES): VITD: 21.46 ng/mL — ABNORMAL LOW (ref 30.00–100.00)

## 2021-02-16 LAB — TSH: TSH: 1.56 u[IU]/mL (ref 0.35–5.50)

## 2021-02-16 LAB — HEMOGLOBIN A1C: Hgb A1c MFr Bld: 5.5 % (ref 4.6–6.5)

## 2021-02-16 NOTE — Progress Notes (Signed)
Chief Complaint:  Thomas Cooley is a 45 y.o. male who presents today for his annual comprehensive physical exam.    Assessment/Plan:  New/Acute Problems: Bloating No red flags.  Discussed keeping food diary.  Also discussed FODMAPs diet.  Chronic Problems Addressed Today: Hyperglycemia Check A1c.   Dyslipidemia Check lipids.  Discussed lifestyle modifications.  Vitamin D deficiency Check vitamin D.    Body mass index is 28.09 kg/m. / Overweight  BMI Metric Follow Up - 02/16/21 0924       BMI Metric Follow Up-Please document annually   BMI Metric Follow Up Education provided              Preventative Healthcare: Check labs. Will refer for screening colonoscopy.  Patient Counseling(The following topics were reviewed and/or handout was given):  -Nutrition: Stressed importance of moderation in sodium/caffeine intake, saturated fat and cholesterol, caloric balance, sufficient intake of fresh fruits, vegetables, and fiber.  -Stressed the importance of regular exercise.   -Substance Abuse: Discussed cessation/primary prevention of tobacco, alcohol, or other drug use; driving or other dangerous activities under the influence; availability of treatment for abuse.   -Injury prevention: Discussed safety belts, safety helmets, smoke detector, smoking near bedding or upholstery.   -Sexuality: Discussed sexually transmitted diseases, partner selection, use of condoms, avoidance of unintended pregnancy and contraceptive alternatives.   -Dental health: Discussed importance of regular tooth brushing, flossing, and dental visits.  -Health maintenance and immunizations reviewed. Please refer to Health maintenance section.  Return to care in 1 year for next preventative visit.     Subjective:  HPI:  He has no acute complaints today.  He has had intermittent issues with bloating for the last several years.  No obvious triggers.  No obvious precipitating events.  No nausea or  vomiting.  Has had issues with constipation and hemorrhoids in the past but none recently.  No specific treatments tried.  Lifestyle Diet: Balanced. Plenty of fruits and vegetables.  Exercise: Exercises 3-4 times per week. Working on cardio.   Depression screen PHQ 2/9 02/16/2021  Decreased Interest 0  Down, Depressed, Hopeless 0  PHQ - 2 Score 0    Health Maintenance Due  Topic Date Due   Hepatitis C Screening  Never done   COLONOSCOPY (Pts 45-77yrs Insurance coverage will need to be confirmed)  Never done     ROS: Per HPI, otherwise a complete review of systems was negative.   PMH:  The following were reviewed and entered/updated in epic: Past Medical History:  Diagnosis Date   Hyperlipidemia    Internal hemorrhoid, bleeding    Vitamin D deficiency    Patient Active Problem List   Diagnosis Date Noted   Lumbar spondylosis 02/16/2020   Hyperglycemia 12/13/2018   Dyslipidemia 04/06/2017   Vitamin D deficiency 03/27/2016   NAFL (nonalcoholic fatty liver) 03/27/2016   Past Surgical History:  Procedure Laterality Date   NASAL SINUS SURGERY      Family History  Problem Relation Age of Onset   Diabetes Mother    Anal fissures Mother    Diabetes Father    Kidney failure Father    Anal fissures Father     Medications- reviewed and updated No current outpatient medications on file.   No current facility-administered medications for this visit.    Allergies-reviewed and updated Allergies  Allergen Reactions   Floraquin [Iodoquinol] Rash    Social History   Socioeconomic History   Marital status: Married    Spouse name: Not on  file   Number of children: 2   Years of education: Not on file   Highest education level: Not on file  Occupational History   Occupation: Printmaker  Tobacco Use   Smoking status: Never   Smokeless tobacco: Never  Vaping Use   Vaping Use: Never used  Substance and Sexual Activity   Alcohol use: Yes    Comment: Social     Drug use: No   Sexual activity: Yes    Partners: Female  Other Topics Concern   Not on file  Social History Narrative   Not on file   Social Determinants of Health   Financial Resource Strain: Not on file  Food Insecurity: Not on file  Transportation Needs: Not on file  Physical Activity: Not on file  Stress: Not on file  Social Connections: Not on file        Objective:  Physical Exam: BP 118/80    Pulse 71    Temp 98.3 F (36.8 C)    Ht 5\' 9"  (1.753 m)    Wt 190 lb 3.2 oz (86.3 kg)    SpO2 98%    BMI 28.09 kg/m   Body mass index is 28.09 kg/m. Wt Readings from Last 3 Encounters:  02/16/21 190 lb 3.2 oz (86.3 kg)  02/16/20 188 lb (85.3 kg)  02/03/20 187 lb 12.8 oz (85.2 kg)   Gen: NAD, resting comfortably HEENT: TMs normal bilaterally. OP clear. No thyromegaly noted.  CV: RRR with no murmurs appreciated Pulm: NWOB, CTAB with no crackles, wheezes, or rhonchi GI: Normal bowel sounds present. Soft, Nontender, Nondistended. MSK: no edema, cyanosis, or clubbing noted Skin: warm, dry Neuro: CN2-12 grossly intact. Strength 5/5 in upper and lower extremities. Reflexes symmetric and intact bilaterally.  Psych: Normal affect and thought content     Dennie Moltz M. 02/05/20, MD 02/16/2021 9:25 AM

## 2021-02-16 NOTE — Patient Instructions (Signed)
It was very nice to see you today!  We will check blood work.  I will refer you to get a colonoscopy.  Please keep a diary for a log of what food you eat when you read have bloating symptoms.  If you still cannot pinpoint any exact causes please try a FODMAPS elimination diet.  Continue the good work with diet and exercise.  I will see back in a year.  Please come back to see [place] sooner if needed.9  Take care, Dr Jimmey Ralph  PLEASE NOTE:  If you had any lab tests please let us know if you have not heard back within a few days. You may see your results on mychart before we have a chance to review them but we will give you a call once they are reviewed by [place]. If we ordered any referrals today, please let us know if you have not heard from their office within the next week.   Please try these tips to maintain a healthy lifestyle:  Eat at least 3 REAL meals and 1-2 snacks per day.  Aim for no more than 5 hours between eating.  If you eat breakfast, please do so within one hour of getting up.   Each meal should contain half fruits/vegetables, one quarter protein, and one quarter carbs (no bigger than a computer mouse)  Cut down on sweet beverages. This includes juice, soda, and sweet tea.   Drink at least 1 glass of water with each meal and aim for at least 8 glasses per day  Exercise at least 150 minutes every week.    Preventive Care 26-40 Years Old, Male Preventive care refers to lifestyle choices and visits with your health care provider that can promote health and wellness. Preventive care visits are also called wellness exams. What can I expect for my preventive care visit? Counseling During your preventive care visit, your health care provider may ask about your: Medical history, including: Past medical problems. Family medical history. Current health, including: Emotional well-being. Home life and relationship well-being. Sexual activity. Lifestyle, including: Alcohol,  nicotine or tobacco, and drug use. Access to firearms. Diet, exercise, and sleep habits. Safety issues such as seatbelt and bike helmet use. Sunscreen use. Work and work Astronomer. Physical exam Your health care provider will check your: Height and weight. These may be used to calculate your BMI (body mass index). BMI is a measurement that tells if you are at a healthy weight. Waist circumference. This measures the distance around your waistline. This measurement also tells if you are at a healthy weight and may help predict your risk of certain diseases, such as type 2 diabetes and high blood pressure. Heart rate and blood pressure. Body temperature. Skin for abnormal spots. What immunizations do I need? Vaccines are usually given at various ages, according to a schedule. Your health care provider will recommend vaccines for you based on your age, medical history, and lifestyle or other factors, such as travel or where you work. What tests do I need? Screening Your health care provider may recommend screening tests for certain conditions. This may include: Lipid and cholesterol levels. Diabetes screening. This is done by checking your blood sugar (glucose) after you have not eaten for a while (fasting). Hepatitis B test. Hepatitis C test. HIV (human immunodeficiency virus) test. STI (sexually transmitted infection) testing, if you are at risk. Lung cancer screening. Prostate cancer screening. Colorectal cancer screening. Talk with your health care provider about your test results, treatment options,  and if necessary, the need for more tests. Follow these instructions at home: Eating and drinking  Eat a diet that includes fresh fruits and vegetables, whole grains, lean protein, and low-fat dairy products. Take vitamin and mineral supplements as recommended by your health care provider. Do not drink alcohol if your health care provider tells you not to drink. If you drink  alcohol: Limit how much you have to 0-2 drinks a day. Know how much alcohol is in your drink. In the [place] one drink equals one 12 oz bottle of beer (355 mL), one 5 oz glass of wine (148 mL), or one 1 oz glass of hard liquor (44 mL). Lifestyle Brush your teeth every morning and night with fluoride toothpaste. Floss one time each day. Exercise for at least 30 minutes 5 or more days each week. Do not use any products that contain nicotine or tobacco. These products include cigarettes, chewing tobacco, and vaping devices, such as e-cigarettes. If you need help quitting, ask your health care provider. Do not use drugs. If you are sexually active, practice safe sex. Use a condom or other form of protection to prevent STIs. Take aspirin only as told by your health care provider. Make sure that you understand how much to take and what form to take. Work with your health care provider to find out whether it is safe and beneficial for you to take aspirin daily. Find healthy ways to manage stress, such as: Meditation, yoga, or listening to music. Journaling. Talking to a trusted person. Spending time with friends and family. Minimize exposure to UV radiation to reduce your risk of skin cancer. Safety Always wear your seat belt while driving or riding in a vehicle. Do not drive: If you have been drinking alcohol. Do not ride with someone who has been drinking. When you are tired or distracted. While texting. If you have been using any mind-altering substances or drugs. Wear a helmet and other protective equipment during sports activities. If you have firearms in your house, make sure you follow all gun safety procedures. What's next? Go to your health care provider once a year for an annual wellness visit. Ask your health care provider how often you should have your eyes and teeth checked. Stay up to date on all vaccines. This information is not intended to replace advice given to you by your  health care provider. Make sure you discuss any questions you have with your health care provider. Document Revised: 08/04/2020 Document Reviewed: 08/04/2020 Elsevier Patient Education  2022 [employer].

## 2021-02-16 NOTE — Assessment & Plan Note (Signed)
Check vitamin D. 

## 2021-02-16 NOTE — Assessment & Plan Note (Signed)
Check lipids. Discussed lifestyle modifications.  

## 2021-02-16 NOTE — Assessment & Plan Note (Signed)
Check A1c. 

## 2021-02-17 NOTE — Progress Notes (Signed)
Please inform patient of the following:  Vitamin D low.  Recommend starting 2000 IUs daily.  We should recheck in 3 to 6 months.  Cholesterol elevated compared to last time.  Does not need to start any medications. He should continue to work on diet and exercise.  We can recheck in a year or so.  Everything else is normal.

## 2021-11-14 ENCOUNTER — Encounter: Payer: Self-pay | Admitting: *Deleted

## 2022-02-02 ENCOUNTER — Encounter: Payer: Self-pay | Admitting: *Deleted

## 2022-02-17 ENCOUNTER — Encounter: Payer: Managed Care, Other (non HMO) | Admitting: Family Medicine

## 2022-03-02 ENCOUNTER — Encounter: Payer: Managed Care, Other (non HMO) | Admitting: Family Medicine

## 2022-03-03 ENCOUNTER — Ambulatory Visit (INDEPENDENT_AMBULATORY_CARE_PROVIDER_SITE_OTHER): Payer: Managed Care, Other (non HMO) | Admitting: Family Medicine

## 2022-03-03 ENCOUNTER — Encounter: Payer: Self-pay | Admitting: Family Medicine

## 2022-03-03 VITALS — BP 122/76 | HR 61 | Temp 97.8°F | Ht 69.0 in | Wt 193.4 lb

## 2022-03-03 DIAGNOSIS — Z1211 Encounter for screening for malignant neoplasm of colon: Secondary | ICD-10-CM | POA: Diagnosis not present

## 2022-03-03 DIAGNOSIS — Z0001 Encounter for general adult medical examination with abnormal findings: Secondary | ICD-10-CM

## 2022-03-03 DIAGNOSIS — E785 Hyperlipidemia, unspecified: Secondary | ICD-10-CM | POA: Diagnosis not present

## 2022-03-03 DIAGNOSIS — E559 Vitamin D deficiency, unspecified: Secondary | ICD-10-CM | POA: Diagnosis not present

## 2022-03-03 DIAGNOSIS — K649 Unspecified hemorrhoids: Secondary | ICD-10-CM

## 2022-03-03 DIAGNOSIS — R739 Hyperglycemia, unspecified: Secondary | ICD-10-CM | POA: Diagnosis not present

## 2022-03-03 DIAGNOSIS — Z1159 Encounter for screening for other viral diseases: Secondary | ICD-10-CM

## 2022-03-03 LAB — COMPREHENSIVE METABOLIC PANEL
ALT: 23 U/L (ref 0–53)
AST: 11 U/L (ref 0–37)
Albumin: 5 g/dL (ref 3.5–5.2)
Alkaline Phosphatase: 54 U/L (ref 39–117)
BUN: 11 mg/dL (ref 6–23)
CO2: 28 mEq/L (ref 19–32)
Calcium: 9.8 mg/dL (ref 8.4–10.5)
Chloride: 103 mEq/L (ref 96–112)
Creatinine, Ser: 0.91 mg/dL (ref 0.40–1.50)
GFR: 100.9 mL/min (ref >60.00)
Glucose, Bld: 92 mg/dL (ref 70–99)
Potassium: 4.6 mEq/L (ref 3.5–5.1)
Sodium: 138 mEq/L (ref 135–145)
Total Bilirubin: 0.8 mg/dL (ref 0.2–1.2)
Total Protein: 7.9 g/dL (ref 6.0–8.3)

## 2022-03-03 LAB — CBC
HCT: 45.7 % (ref 39.0–52.0)
Hemoglobin: 15.6 g/dL (ref 13.0–17.0)
MCHC: 34.2 g/dL (ref 30.0–36.0)
MCV: 89.2 fl (ref 78.0–100.0)
Platelets: 227 10*3/uL (ref 150.0–400.0)
RBC: 5.12 Mil/uL (ref 4.22–5.81)
RDW: 13 % (ref 11.5–15.5)
WBC: 5 10*3/uL (ref 4.0–10.5)

## 2022-03-03 LAB — LIPID PANEL
Cholesterol: 229 mg/dL — ABNORMAL HIGH (ref 0–200)
HDL: 31.7 mg/dL — ABNORMAL LOW (ref >39.00)
LDL Cholesterol: 163 mg/dL — ABNORMAL HIGH (ref 0–99)
NonHDL: 196.94
Total CHOL/HDL Ratio: 7
Triglycerides: 172 mg/dL — ABNORMAL HIGH (ref 0.0–149.0)
VLDL: 34.4 mg/dL (ref 0.0–40.0)

## 2022-03-03 LAB — VITAMIN D 25 HYDROXY (VIT D DEFICIENCY, FRACTURES): VITD: 15.14 ng/mL — ABNORMAL LOW (ref 30.00–100.00)

## 2022-03-03 LAB — TSH: TSH: 1.93 u[IU]/mL (ref 0.35–5.50)

## 2022-03-03 LAB — HEMOGLOBIN A1C: Hgb A1c MFr Bld: 5.5 % (ref 4.6–6.5)

## 2022-03-03 MED ORDER — HYDROCORTISONE ACETATE 25 MG RE SUPP: 25.0000 mg | Freq: Two times a day (BID) | RECTAL | 0 refills | Status: DC

## 2022-03-03 NOTE — Assessment & Plan Note (Signed)
Check A1c 

## 2022-03-03 NOTE — Assessment & Plan Note (Signed)
Has previously seen GI for this.  He has had a flareup recently but symptoms to be well-controlled over the last few days.  He has not noticed any sort of rectal bleeding.  Occasionally gets pain with flareup.  He uses hydrocortisone as needed.  Will refill today.

## 2022-03-03 NOTE — Patient Instructions (Signed)
It was very nice to see you today!  We we will check blood work today.  We will order you cologuard.  Positively will need to see GI to get a colonoscopy.  I will refill your hydrocortisone suppository.  Please continue work on diet and exercise.  We will see back in year for your next physical.  Please come back to see Thomas Cooley sooner if needed.  Take care, Dr Jerline Pain  PLEASE NOTE:  If you had any lab tests, please let Thomas Cooley know if you have not heard back within a few days. You may see your results on mychart before we have a chance to review them but we will give you a call once they are reviewed by Thomas Cooley.   If we ordered any referrals today, please let Thomas Cooley know if you have not heard from their office within the next week.   If you had any urgent prescriptions sent in today, please check with the pharmacy within an hour of our visit to make sure the prescription was transmitted appropriately.   Please try these tips to maintain a healthy lifestyle:  Eat at least 3 REAL meals and 1-2 snacks per day.  Aim for no more than 5 hours between eating.  If you eat breakfast, please do so within one hour of getting up.   Each meal should contain half fruits/vegetables, one quarter protein, and one quarter carbs (no bigger than a computer mouse)  Cut down on sweet beverages. This includes juice, soda, and sweet tea.   Drink at least 1 glass of water with each meal and aim for at least 8 glasses per day  Exercise at least 150 minutes every week.    Preventive Care 47 Years Old, Male Preventive care refers to lifestyle choices and visits with your health care provider that can promote health and wellness. Preventive care visits are also called wellness exams. What can I expect for my preventive care visit? Counseling During your preventive care visit, your health care provider may ask about your: Medical history, including: Past medical problems. Family medical history. Current health,  including: Emotional well-being. Home life and relationship well-being. Sexual activity. Lifestyle, including: Alcohol, nicotine or tobacco, and drug use. Access to firearms. Diet, exercise, and sleep habits. Safety issues such as seatbelt and bike helmet use. Sunscreen use. Work and work Statistician. Physical exam Your health care provider will check your: Height and weight. These may be used to calculate your BMI (body mass index). BMI is a measurement that tells if you are at a healthy weight. Waist circumference. This measures the distance around your waistline. This measurement also tells if you are at a healthy weight and may help predict your risk of certain diseases, such as type 2 diabetes and high blood pressure. Heart rate and blood pressure. Body temperature. Skin for abnormal spots. What immunizations do I need?  Vaccines are usually given at various ages, according to a schedule. Your health care provider will recommend vaccines for you based on your age, medical history, and lifestyle or other factors, such as travel or where you work. What tests do I need? Screening Your health care provider may recommend screening tests for certain conditions. This may include: Lipid and cholesterol levels. Diabetes screening. This is done by checking your blood sugar (glucose) after you have not eaten for a while (fasting). Hepatitis B test. Hepatitis C test. HIV (human immunodeficiency virus) test. STI (sexually transmitted infection) testing, if you are at risk. Lung cancer screening.  Prostate cancer screening. Colorectal cancer screening. Talk with your health care provider about your test results, treatment options, and if necessary, the need for more tests. Follow these instructions at home: Eating and drinking  Eat a diet that includes fresh fruits and vegetables, whole grains, lean protein, and low-fat dairy products. Take vitamin and mineral supplements as recommended  by your health care provider. Do not drink alcohol if your health care provider tells you not to drink. If you drink alcohol: Limit how much you have to 0-2 drinks a day. Know how much alcohol is in your drink. In the [place] one drink equals one 12 oz bottle of beer (355 mL), one 5 oz glass of wine (148 mL), or one 1 oz glass of hard liquor (44 mL). Lifestyle Brush your teeth every morning and night with fluoride toothpaste. Floss one time each day. Exercise for at least 30 minutes 5 or more days each week. Do not use any products that contain nicotine or tobacco. These products include cigarettes, chewing tobacco, and vaping devices, such as e-cigarettes. If you need help quitting, ask your health care provider. Do not use drugs. If you are sexually active, practice safe sex. Use a condom or other form of protection to prevent STIs. Take aspirin only as told by your health care provider. Make sure that you understand how much to take and what form to take. Work with your health care provider to find out whether it is safe and beneficial for you to take aspirin daily. Find healthy ways to manage stress, such as: Meditation, yoga, or listening to music. Journaling. Talking to a trusted person. Spending time with friends and family. Minimize exposure to UV radiation to reduce your risk of skin cancer. Safety Always wear your seat belt while driving or riding in a vehicle. Do not drive: If you have been drinking alcohol. Do not ride with someone who has been drinking. When you are tired or distracted. While texting. If you have been using any mind-altering substances or drugs. Wear a helmet and other protective equipment during sports activities. If you have firearms in your house, make sure you follow all gun safety procedures. What's next? Go to your health care provider once a year for an annual wellness visit. Ask your health care provider how often you should have your eyes and teeth  checked. Stay up to date on all vaccines. This information is not intended to replace advice given to you by your health care provider. Make sure you discuss any questions you have with your health care provider. Document Revised: 08/04/2020 Document Reviewed: 08/04/2020 Elsevier Patient Education  [employer].

## 2022-03-03 NOTE — Assessment & Plan Note (Signed)
Discussed lifestyle modifications. Check lipids.  ?

## 2022-03-03 NOTE — Assessment & Plan Note (Signed)
Check vitamin D.

## 2022-03-03 NOTE — Progress Notes (Signed)
Chief Complaint:  Thomas Cooley is a 47 y.o. male who presents today for his annual comprehensive physical exam.    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  Reassuring symptoms have not changed for the last several years.  Will continue with watchful waiting.  We discussed reasons to return to care.  Chronic Problems Addressed Today: Vitamin D deficiency Check vitamin D.  Dyslipidemia Discussed lifestyle modifications.  Check lipids.  Hyperglycemia Check A1c.  Hemorrhoid Has previously seen GI for this.  He has had a flareup recently but symptoms to be well-controlled over the last few days.  He has not noticed any sort of rectal bleeding.  Occasionally gets pain with flareup.  He uses hydrocortisone as needed.  Will refill today.   Preventative Healthcare: Patient is interested in cologuard.  Discussed with patient that due to his hemorrhoids would be better to get cologuard due to high likelihood of a positive result with cologuard.  He has not had any blood in his stool recently and would like to start with cologuard.  We will order today.    Check labs today.  Up-to-date on vaccines.  Patient Counseling(The following topics were reviewed and/or handout was given):  -Nutrition: Stressed importance of moderation in sodium/caffeine intake, saturated fat and cholesterol, caloric balance, sufficient intake of fresh fruits, vegetables, and fiber.  -Stressed the importance of regular exercise.   -Substance Abuse: Discussed cessation/primary prevention of tobacco, alcohol, or other drug use; driving or other dangerous activities under the influence; availability of treatment for abuse.   -Injury prevention: Discussed safety belts, safety helmets, smoke detector, smoking near bedding or upholstery.   -Sexuality: Discussed sexually transmitted diseases, partner selection, use of condoms, avoidance of unintended pregnancy  and contraceptive alternatives.   -Dental health: Discussed importance of regular tooth brushing, flossing, and dental visits.  -Health maintenance and immunizations reviewed. Please refer to Health maintenance section.  Return to care in 1 year for next preventative visit.     Subjective:  HPI:  He has no acute complaints today.   See A/P for status of chronic conditions.  He is doing well today.  Does have a lump on right lower abdomen.  This has been present for years.  No significant change.  Occasionally tender on palpation.  Lifestyle Diet: Balanced. Plenty of fruits and vegetables.  Exercise: cardio multiple times per week. Also weight training.      03/03/2022   11:07 AM  Depression screen PHQ 2/9  Decreased Interest 0  Down, Depressed, Hopeless 1  PHQ - 2 Score 1    Health Maintenance Due  Topic Date Due   Hepatitis C Screening  Never done   COLONOSCOPY (Pts 45-66yrs Insurance coverage will need to be confirmed)  Never done   COVID-19 Vaccine (5 - 2023-24 season) 10/21/2021     ROS: Per HPI, otherwise a complete review of systems was negative.   PMH:  The following were reviewed and entered/updated in epic: Past Medical History:  Diagnosis Date   Hyperlipidemia    Internal hemorrhoid, bleeding    Vitamin D deficiency    Patient Active Problem List   Diagnosis Date Noted   Hemorrhoid 03/03/2022   Lumbar spondylosis 02/16/2020   Hyperglycemia 12/13/2018   Dyslipidemia 04/06/2017   Vitamin D deficiency 03/27/2016   NAFL (nonalcoholic fatty liver) 98/92/1194   Past Surgical History:  Procedure Laterality Date   NASAL SINUS SURGERY      Family History  Problem Relation Age of Onset   Diabetes Mother    Anal fissures Mother    Diabetes Father    Kidney failure Father    Anal fissures Father     Medications- reviewed and updated Current Outpatient Medications  Medication Sig Dispense Refill   hydrocortisone (ANUSOL-HC) 25 MG suppository Place 1  suppository (25 mg total) rectally 2 (two) times daily. 12 suppository 0   No current facility-administered medications for this visit.    Allergies-reviewed and updated Allergies  Allergen Reactions   Floraquin [Iodoquinol] Rash    Social History   Socioeconomic History   Marital status: Married    Spouse name: Not on file   Number of children: 2   Years of education: Not on file   Highest education level: Not on file  Occupational History   Occupation: Manufacturing engineer  Tobacco Use   Smoking status: Never   Smokeless tobacco: Never  Vaping Use   Vaping Use: Never used  Substance and Sexual Activity   Alcohol use: Yes    Comment: Social    Drug use: No   Sexual activity: Yes    Partners: Female  Other Topics Concern   Not on file  Social History Narrative   Not on file   Social Determinants of Health   Financial Resource Strain: Not on file  Food Insecurity: Not on file  Transportation Needs: Not on file  Physical Activity: Not on file  Stress: Not on file  Social Connections: Not on file        Objective:  Physical Exam: BP 122/76   Pulse 61   Temp 97.8 F (36.6 C) (Temporal)   Ht 5\' 9"  (1.753 m)   Wt 193 lb 6.4 oz (87.7 kg)   SpO2 99%   BMI 28.56 kg/m   Body mass index is 28.56 kg/m. Wt Readings from Last 3 Encounters:  03/03/22 193 lb 6.4 oz (87.7 kg)  02/16/21 190 lb 3.2 oz (86.3 kg)  02/16/20 188 lb (85.3 kg)   Gen: NAD, resting comfortably HEENT: TMs normal bilaterally. OP clear. No thyromegaly noted.  CV: RRR with no murmurs appreciated Pulm: NWOB, CTAB with no crackles, wheezes, or rhonchi GI: Normal bowel sounds present. Soft, Nontender, Nondistended.  Small freely mobile lump on right lower abdomen MSK: no edema, cyanosis, or clubbing noted Skin: warm, dry Neuro: CN2-12 grossly intact. Strength 5/5 in upper and lower extremities. Reflexes symmetric and intact bilaterally.  Psych: Normal affect and thought content     Kasyn Stouffer M.  Jerline Pain, MD 03/03/2022 11:42 AM

## 2022-03-06 LAB — HEPATITIS C ANTIBODY: Hepatitis C Ab: NONREACTIVE

## 2022-03-09 NOTE — Progress Notes (Signed)
Please inform patient of the following:  Vitamin D low.  Recommend he start 50,000 IUs once weekly.  Please send a new prescription.  We should recheck in 3 to 6 months.  Cholesterol is elevated but about the same as last year.  Do not need to start any medications for this however he should continue to work on diet and exercise and we can recheck in a year.  The rest of his labs are all normal and we can recheck in a year.

## 2022-03-17 ENCOUNTER — Other Ambulatory Visit: Payer: Self-pay | Admitting: *Deleted

## 2022-03-17 MED ORDER — VITAMIN D (ERGOCALCIFEROL) 1.25 MG (50000 UNIT) PO CAPS: 50000.0000 [IU] | ORAL_CAPSULE | ORAL | 0 refills | Status: DC

## 2022-03-17 MED ORDER — HYDROCORTISONE ACETATE 25 MG RE SUPP: 25.0000 mg | Freq: Two times a day (BID) | RECTAL | 0 refills | Status: DC

## 2022-04-18 LAB — COLOGUARD: COLOGUARD: NEGATIVE

## 2022-04-19 NOTE — Progress Notes (Signed)
Please inform patient of the following:  Good news! Cologuard is negative. We can recheck in 3 years.  Thomas Cooley. Jerline Pain, MD 04/19/2022 11:08 AM

## 2022-07-18 ENCOUNTER — Other Ambulatory Visit: Payer: Self-pay | Admitting: Family Medicine

## 2022-10-21 IMAGING — CT CT ABDOMEN W/ CM
3 of 5 series · 16 of 46 positions shown, 18 images · IV contrast (APPLIED)
Comparison: None.

CLINICAL DATA: Bilateral upper abdominal pain radiating to the back
intermittently for 10-15 days. Elevated lipase. Acute pancreatitis
suspected.

EXAM:
CT ABDOMEN WITH CONTRAST
TECHNIQUE: Multidetector CT imaging of the abdomen was performed using the
standard protocol following bolus administration of intravenous
contrast.
CONTRAST:  100mL OMNIPAQUE IOHEXOL 300 MG/ML  SOLN

[Series 2: axial st · axial · 0.78mm/px · z∈[-508,-278]mm · 9 of 58 slices shown, 11 images]
[im 6/58  soft-tissue]
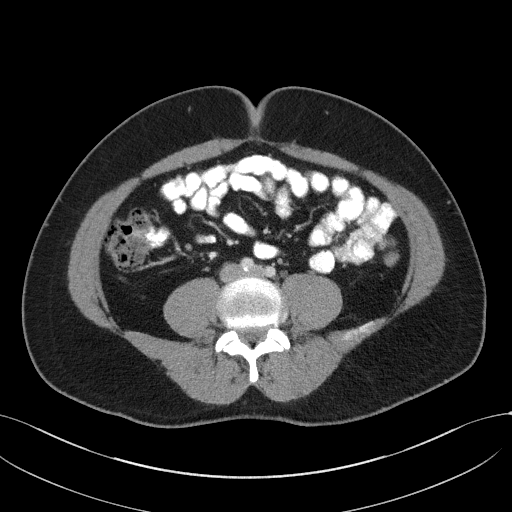
[im 6/58  bone]
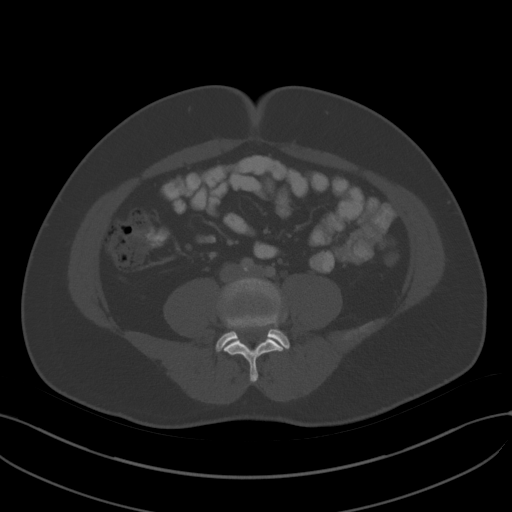
[im 12/58  soft-tissue]
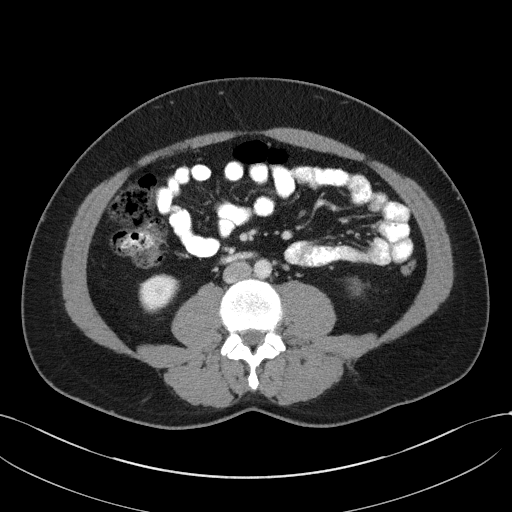
[im 18/58  soft-tissue]
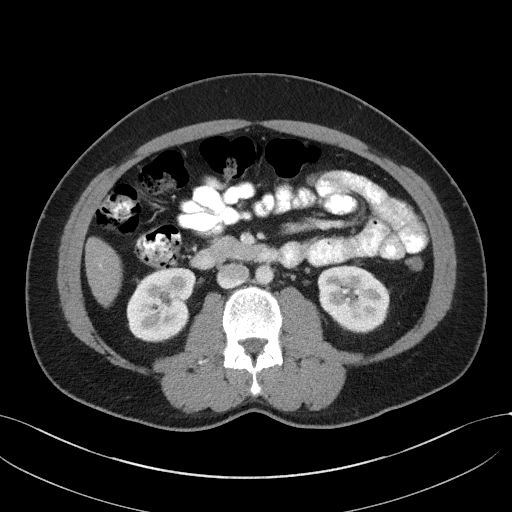
[im 23/58  soft-tissue]
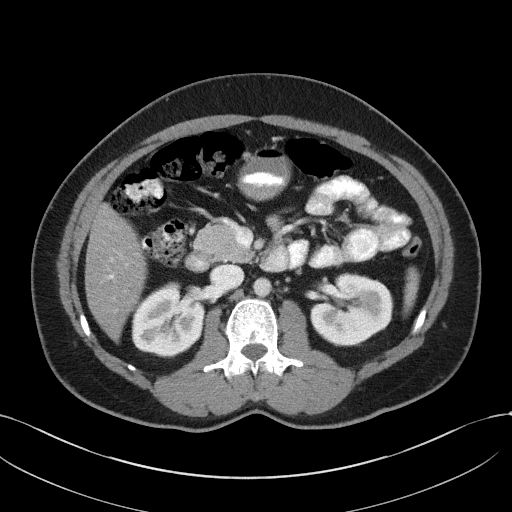
[im 29/58  soft-tissue]
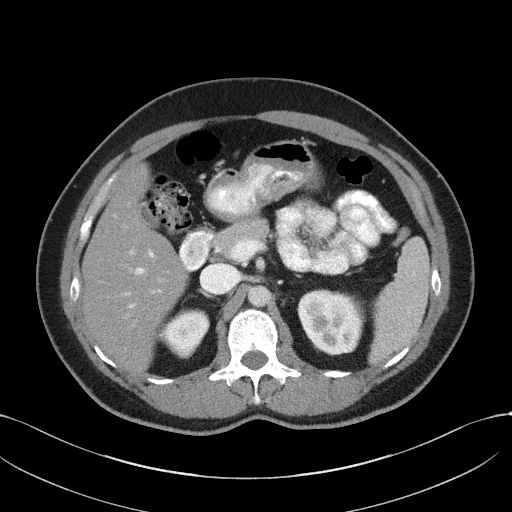
[im 35/58  soft-tissue]
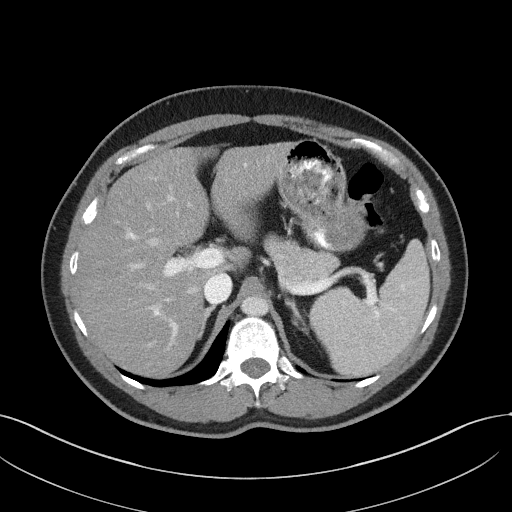
[im 40/58  soft-tissue]
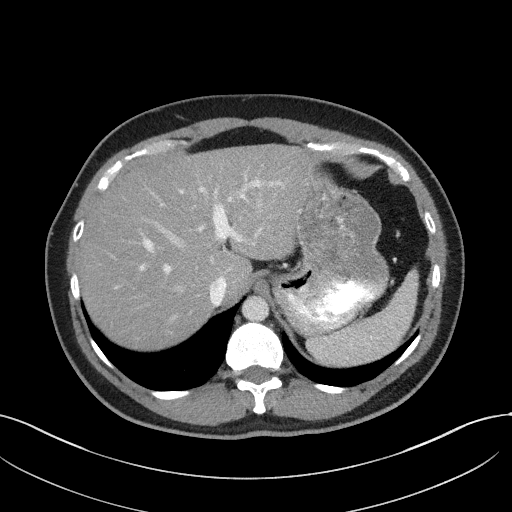
[im 46/58  soft-tissue]
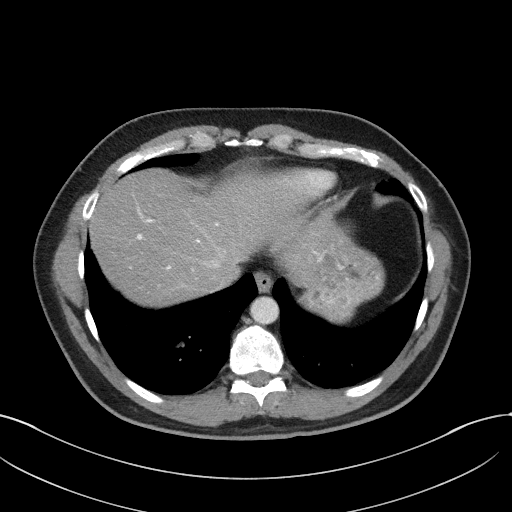
[im 52/58  soft-tissue]
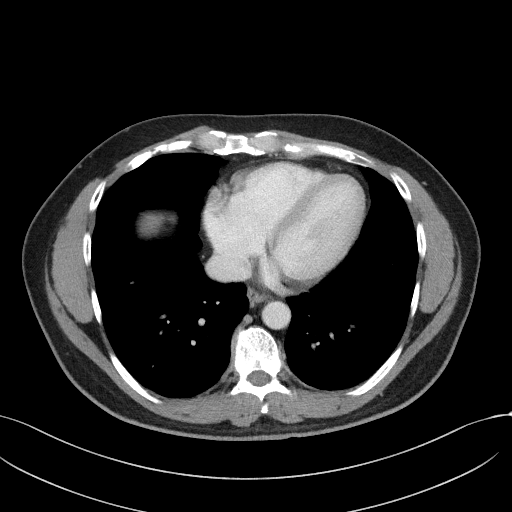
[im 52/58  bone]
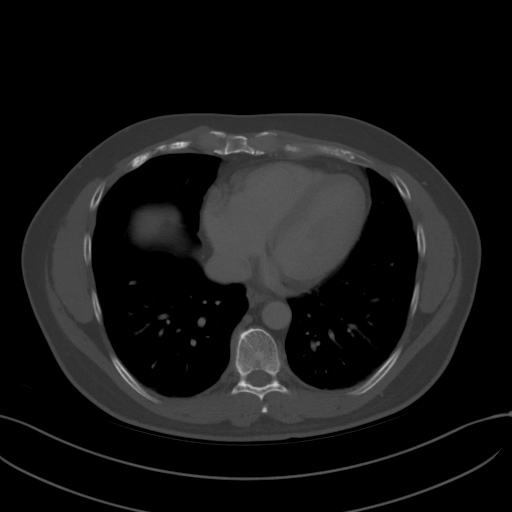

[Series 4: lung bases · axial · 0.69mm/px · z∈[-390,-336]mm · 4 of 77 slices shown]
[im 6/77  bone]
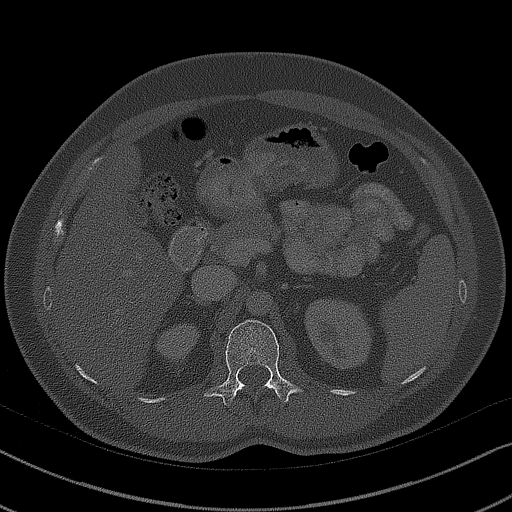
[im 17/77  bone]
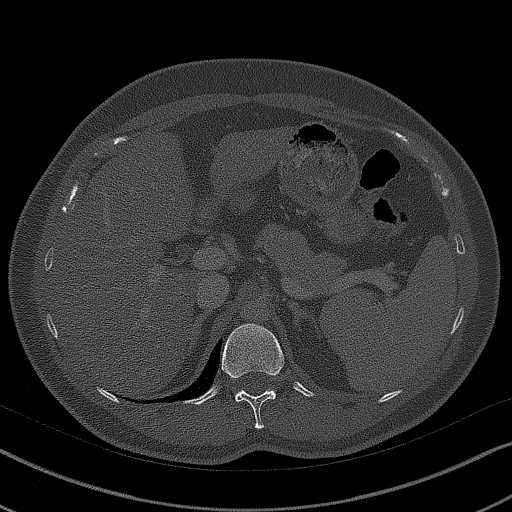
[im 28/77  bone]
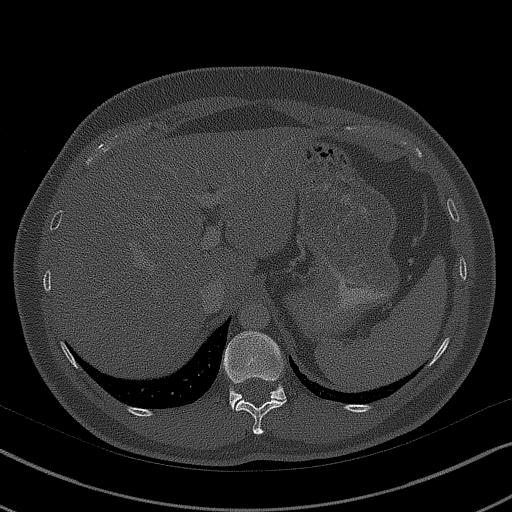
[im 33/77  bone]
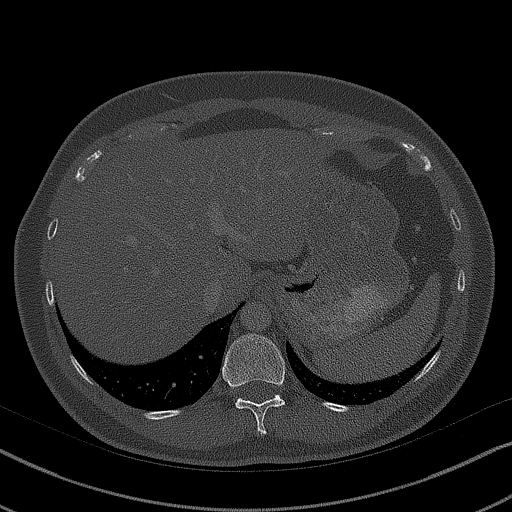

[Series 5: coronal st · coronal · 0.57mm/px · 3 of 101 slices shown]
[im 34/101  soft-tissue]
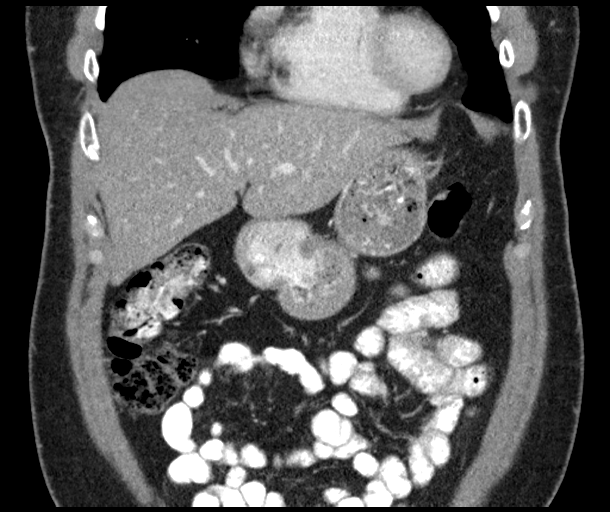
[im 45/101  soft-tissue]
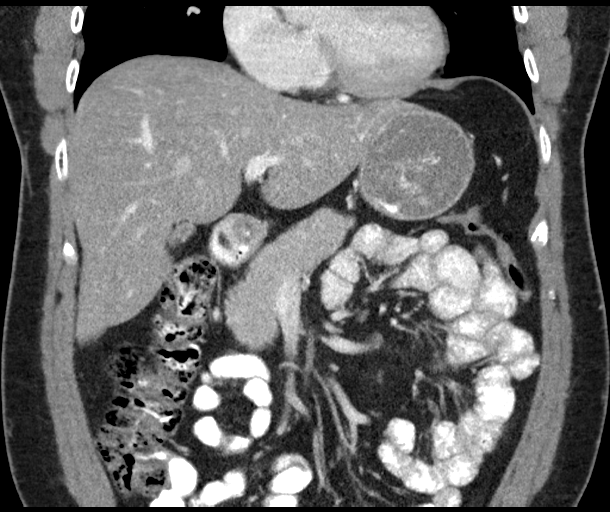
[im 56/101  soft-tissue]
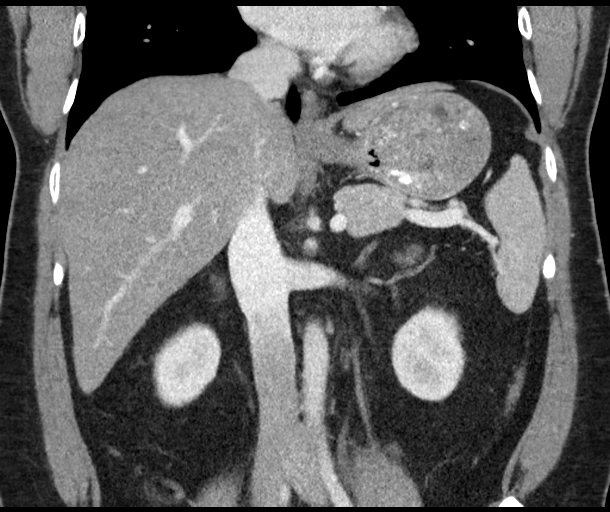

[16 of 46 positions shown; findings below may reference images not displayed]

FINDINGS: Lower chest: No significant pulmonary nodules or acute consolidative
airspace disease.

Hepatobiliary: Suggestion of diffuse hepatic steatosis. Normal liver
size. No definite liver surface irregularity. No liver masses.
Normal gallbladder with no radiopaque cholelithiasis. No biliary
ductal dilatation.

Pancreas: No peripancreatic fat stranding or fluid. Normal
pancreatic parenchymal enhancement with no pancreatic mass or duct
dilation.

Spleen: Normal size. No mass.

Adrenals/Urinary Tract: Normal adrenals. Normal kidneys with no
hydronephrosis and no renal mass.

Stomach/Bowel: Normal non-distended stomach. Visualized small and
large bowel is normal caliber, with no bowel wall thickening.

Vascular/Lymphatic: Normal caliber abdominal aorta. Patent portal,
splenic, hepatic and renal veins. No pathologically enlarged lymph
nodes in the abdomen.

Other: No pneumoperitoneum, ascites or focal fluid collection.

Musculoskeletal: No aggressive appearing focal osseous lesions.
Minimal thoracolumbar spondylosis.
IMPRESSION: 1. No acute abnormality.  No CT findings of acute pancreatitis.
2. Suggestion of diffuse hepatic steatosis.

## 2022-12-18 LAB — HEPATIC FUNCTION PANEL
ALT: 24 U/L (ref 10–40)
AST: 19 (ref 14–40)
Alkaline Phosphatase: 55.27 (ref 25–125)
Bilirubin, Direct: 0.3
Bilirubin, Total: 0.9

## 2022-12-18 LAB — VITAMIN D 25 HYDROXY (VIT D DEFICIENCY, FRACTURES): Vit D, 25-Hydroxy: 26.39

## 2022-12-18 LAB — BASIC METABOLIC PANEL
BUN: 15 (ref 4–21)
Creatinine: 1.4 — AB (ref 0.6–1.3)
Glucose: 81

## 2022-12-18 LAB — TSH: TSH: 2.79 (ref 0.41–5.90)

## 2022-12-18 LAB — LIPID PANEL
Cholesterol: 209 — AB (ref 0–200)
HDL: 34 — AB (ref 35–70)
LDL Cholesterol: 150
LDl/HDL Ratio: 4.5
Triglycerides: 131 (ref 40–160)

## 2022-12-18 LAB — COMPREHENSIVE METABOLIC PANEL
Albumin: 4.6 (ref 3.5–5.0)
Calcium: 9 (ref 8.7–10.7)
Globulin: 2.77

## 2022-12-18 LAB — CBC AND DIFFERENTIAL
HCT: 44 (ref 41–53)
Hemoglobin: 14.8 (ref 13.5–17.5)
Neutrophils Absolute: 2.74
Platelets: 230 10*3/uL (ref 150–400)
WBC: 4800

## 2022-12-18 LAB — VITAMIN B12: Vitamin B-12: 315.34

## 2022-12-18 LAB — CBC: RBC: 4.94 (ref 3.87–5.11)

## 2023-02-05 ENCOUNTER — Encounter: Payer: Self-pay | Admitting: Family Medicine

## 2023-02-05 ENCOUNTER — Ambulatory Visit (INDEPENDENT_AMBULATORY_CARE_PROVIDER_SITE_OTHER): Payer: Managed Care, Other (non HMO) | Admitting: Family Medicine

## 2023-02-05 ENCOUNTER — Telehealth: Payer: Self-pay | Admitting: Gastroenterology

## 2023-02-05 VITALS — BP 124/77 | HR 67 | Ht 69.0 in | Wt 195.0 lb

## 2023-02-05 DIAGNOSIS — K6289 Other specified diseases of anus and rectum: Secondary | ICD-10-CM | POA: Diagnosis not present

## 2023-02-05 MED ORDER — HYDROCORTISONE ACETATE 25 MG RE SUPP: 25.0000 mg | Freq: Two times a day (BID) | RECTAL | 0 refills | Status: DC

## 2023-02-05 NOTE — Progress Notes (Signed)
Acute Office Visit  Subjective:     Patient ID: Thomas Cooley, male    DOB: 09-17-1975, 47 y.o.   MRN: [id number]  Chief Complaint  Patient presents with   Rectal Problems    HPI Patient is in today for rectal pain.   Discussed the use of AI scribe software for clinical note transcription with the patient, who gave verbal consent to proceed.  History of Present Illness   The patient presented with a new onset of anal discomfort that started the day prior to the consultation. They described the sensation as a bulging blister-like formation that was completely blocking their anus. The patient reported that the area had become increasingly painful since the onset, rating the pain as a 5 on a scale of 0 to 10. The pain was exacerbated by sitting and following a bowel movement. There was no reported bleeding from the area.  The patient had a bowel movement on the morning of the consultation, which was normal, but reported that the pain had intensified afterwards. They also reported constipation the day before the consultation.  The patient had a history of internal hemorrhoids, which were characterized by bleeding for three days and were previously managed with over-the-counter wipes and hydrocortisone suppositories. The patient had tried using a hydrocortisone suppository the day before the consultation, which they felt may have aggravated the current issue.  The patient had been maintaining cleanliness of the area by washing, but had not applied any other treatments apart from the hydrocortisone suppository. They reported no blood but persistent pain in the area. The patient also reported a skipped bowel movement two days prior to the consultation, and a harder than normal bowel movement the day before the consultation.             ROS All review of systems negative except what is listed in the HPI      Objective:    BP 124/77   Pulse 67   Ht 5\' 9"  (1.753 m)   Wt 195 lb  (88.5 kg)   SpO2 100%   BMI 28.80 kg/m    Physical Exam Vitals reviewed. Exam conducted with a chaperone present.  Constitutional:      Appearance: Normal appearance.  Genitourinary:   Skin:    General: Skin is warm.  Neurological:     Mental Status: He is alert and oriented to person, place, and time.  Psychiatric:        Mood and Affect: Mood normal.        Behavior: Behavior normal.        Thought Content: Thought content normal.        Judgment: Judgment normal.     No results found for any visits on 02/05/23.      Assessment & Plan:   Problem List Items Addressed This Visit   None Visit Diagnoses       Rectal or anal pain    -  Primary   Relevant Medications   hydrocortisone (ANUSOL-HC) 25 MG suppository   Other Relevant Orders   Ambulatory referral to Gastroenterology   Ambulatory referral to General Surgery     Recommend starting with supportive measures - suppository, sitz bath, avoid straining, etc.  Patient would like to get GI/gen-surg referrals in case supportive measures do not work as he is quite uncomfortable at times. He will cancel appt if symptoms resolve with supportive measures.  Patient aware of signs/symptoms requiring further/urgent evaluation.    Meds ordered this  encounter  Medications   hydrocortisone (ANUSOL-HC) 25 MG suppository    Sig: Place 1 suppository (25 mg total) rectally 2 (two) times daily.    Dispense:  12 suppository    Refill:  0    Supervising Provider:   Danise Edge A [[id number]]    Return if symptoms worsen or fail to improve.  Clayborne Dana, NP

## 2023-02-05 NOTE — Patient Instructions (Addendum)
Avoid prolonged sitting.  Avoid straining with bowel movements.  Increase high fiber food intake.   Participate in regular exercise. Bulk forming agents: Metamucil, Citrucel, Fibercon Stool softeners: Colace Irritation/Itching relief: OTC Anusol, Preparation H, hydrocortisone suppositories, warm sitz baths

## 2023-02-05 NOTE — Telephone Encounter (Signed)
PT has a blister at the opening of his anus and would like to know of any options for relief. Please advise.

## 2023-02-05 NOTE — Telephone Encounter (Signed)
The pt has not been seen in almost 6 years. He has a blister on his rectum that he is concerned with. I have advised that he call his PCP for an appt and eval.  He will call back if he needs an appt with our office.

## 2023-02-23 ENCOUNTER — Telehealth: Payer: Self-pay | Admitting: Family Medicine

## 2023-02-23 NOTE — Telephone Encounter (Unsigned)
 Copied from CRM [id number]. Topic: General - Other >> Feb 22, 2023  4:24 PM Franky GRADE wrote: Reason for CRM: Patient sees Dr.Parker but would like to start seeing Dr.Hunter.I advised that Dr.Hunter was not seeing new patients at the moment. Patient asked if there is an exception that can be made.

## 2023-02-23 NOTE — Telephone Encounter (Signed)
 I'm only able to accept family members of current patients or physician referrals at this time-if either of these apply I can

## 2023-02-28 NOTE — Telephone Encounter (Signed)
 Copied from CRM [id number]. Topic: Appointments - Transfer of Care >> Feb 28, 2023 11:05 AM Thersia BROCKS wrote: Pt is requesting to transfer FROM: Dr.Parker Pt is requesting to transfer TO: Dr Jesus  Reason for requested transfer: Wants to be seen by someone else, doesn't feel like Dr.Parker examines him like he wants  It is the responsibility of the team the patient would like to transfer to (Dr. Jesus) to reach out to the patient if for any reason this transfer is not acceptable.

## 2023-03-09 ENCOUNTER — Other Ambulatory Visit: Payer: Self-pay | Admitting: Family Medicine

## 2023-03-09 ENCOUNTER — Encounter: Payer: Self-pay | Admitting: Family Medicine

## 2023-03-09 ENCOUNTER — Ambulatory Visit (INDEPENDENT_AMBULATORY_CARE_PROVIDER_SITE_OTHER): Payer: Managed Care, Other (non HMO) | Admitting: Family Medicine

## 2023-03-09 VITALS — BP 100/64 | HR 77 | Temp 97.6°F | Ht 69.0 in | Wt 190.2 lb

## 2023-03-09 DIAGNOSIS — E790 Hyperuricemia without signs of inflammatory arthritis and tophaceous disease: Secondary | ICD-10-CM | POA: Diagnosis not present

## 2023-03-09 DIAGNOSIS — Z0001 Encounter for general adult medical examination with abnormal findings: Secondary | ICD-10-CM

## 2023-03-09 DIAGNOSIS — N4889 Other specified disorders of penis: Secondary | ICD-10-CM

## 2023-03-09 DIAGNOSIS — K649 Unspecified hemorrhoids: Secondary | ICD-10-CM

## 2023-03-09 DIAGNOSIS — E559 Vitamin D deficiency, unspecified: Secondary | ICD-10-CM

## 2023-03-09 DIAGNOSIS — K6289 Other specified diseases of anus and rectum: Secondary | ICD-10-CM | POA: Diagnosis not present

## 2023-03-09 DIAGNOSIS — E785 Hyperlipidemia, unspecified: Secondary | ICD-10-CM | POA: Diagnosis not present

## 2023-03-09 DIAGNOSIS — R739 Hyperglycemia, unspecified: Secondary | ICD-10-CM

## 2023-03-09 LAB — CBC
HCT: 43.7 % (ref 39.0–52.0)
Hemoglobin: 14.8 g/dL (ref 13.0–17.0)
MCHC: 33.8 g/dL (ref 30.0–36.0)
MCV: 88.7 fL (ref 78.0–100.0)
Platelets: 228 10*3/uL (ref 150.0–400.0)
RBC: 4.92 Mil/uL (ref 4.22–5.81)
RDW: 13 % (ref 11.5–15.5)
WBC: 4.2 10*3/uL (ref 4.0–10.5)

## 2023-03-09 LAB — LIPID PANEL
Cholesterol: 226 mg/dL — ABNORMAL HIGH (ref 0–200)
HDL: 33.1 mg/dL — ABNORMAL LOW (ref >39.00)
LDL Cholesterol: 162 mg/dL — ABNORMAL HIGH (ref 0–99)
NonHDL: 193.06
Total CHOL/HDL Ratio: 7
Triglycerides: 153 mg/dL — ABNORMAL HIGH (ref 0.0–149.0)
VLDL: 30.6 mg/dL (ref 0.0–40.0)

## 2023-03-09 LAB — URINALYSIS, ROUTINE W REFLEX MICROSCOPIC
Bilirubin Urine: NEGATIVE
Hgb urine dipstick: NEGATIVE
Ketones, ur: NEGATIVE
Leukocytes,Ua: NEGATIVE
Nitrite: NEGATIVE
Specific Gravity, Urine: 1.02 (ref 1.000–1.030)
Total Protein, Urine: NEGATIVE
Urine Glucose: NEGATIVE
Urobilinogen, UA: 0.2 (ref 0.0–1.0)
pH: 6 (ref 5.0–8.0)

## 2023-03-09 LAB — COMPREHENSIVE METABOLIC PANEL
ALT: 20 U/L (ref 0–53)
AST: 15 U/L (ref 0–37)
Albumin: 4.7 g/dL (ref 3.5–5.2)
Alkaline Phosphatase: 51 U/L (ref 39–117)
BUN: 15 mg/dL (ref 6–23)
CO2: 24 meq/L (ref 19–32)
Calcium: 9.2 mg/dL (ref 8.4–10.5)
Chloride: 105 meq/L (ref 96–112)
Creatinine, Ser: 0.93 mg/dL (ref 0.40–1.50)
GFR: 97.6 mL/min (ref >60.00)
Glucose, Bld: 99 mg/dL (ref 70–99)
Potassium: 4.3 meq/L (ref 3.5–5.1)
Sodium: 137 meq/L (ref 135–145)
Total Bilirubin: 0.7 mg/dL (ref 0.2–1.2)
Total Protein: 7.4 g/dL (ref 6.0–8.3)

## 2023-03-09 LAB — VITAMIN D 25 HYDROXY (VIT D DEFICIENCY, FRACTURES): VITD: 31.29 ng/mL (ref 30.00–100.00)

## 2023-03-09 LAB — HEMOGLOBIN A1C: Hgb A1c MFr Bld: 5.7 % (ref 4.6–6.5)

## 2023-03-09 LAB — URIC ACID: Uric Acid, Serum: 7.6 mg/dL (ref 4.0–7.8)

## 2023-03-09 LAB — PSA: PSA: 1.41 ng/mL (ref 0.10–4.00)

## 2023-03-09 LAB — VITAMIN B12: Vitamin B-12: 576 pg/mL (ref 211–911)

## 2023-03-09 LAB — TSH: TSH: 1.59 u[IU]/mL (ref 0.35–5.50)

## 2023-03-09 MED ORDER — HYDROCORTISONE ACETATE 25 MG RE SUPP: 25.0000 mg | Freq: Two times a day (BID) | RECTAL | 0 refills | Status: DC

## 2023-03-09 NOTE — Progress Notes (Signed)
Chief Complaint:  Thomas Cooley is a 48 y.o. male who presents today for his annual comprehensive physical exam.    Assessment/Plan:  New/Acute Problems: Penis Irritation  No red flags.  Reassuring exam.  May have had a small amount of balanitis though no obvious abnormalities on exam today.  He is working on Presenter, broadcasting measures with improvement.  Recommended topical zinc oxide as needed if he does have a recurrence.  It is okay for him to occasionally use hydrocortisone cream as well.  He will let us know if this continues to be an issue and we can empirically treat with topical azole or refer to urology.  Chronic Problems Addressed Today: Hemorrhoid Did have flipper few weeks ago but symptoms have resolved.  No obvious hemorrhoids on exam today.  He can continue hydrocortisone cream as needed.  We discussed importance of soft and regular bowel movements 1-2 times daily and avoiding prolonged sitting having the toilet.  We will refer to GI if this continues to be an issue.  Dyslipidemia Check lipids.  Hyperglycemia Check A1c.  Elevated uric acid in blood Check uric acid level.  He has not had any signs or symptoms of acute gout flare-do not think that we need to start any uric acid lowering medications at this point.  He is agreeable to this plan.  We did discuss low purine diet and dietary strategies to reduce uric acid level.  He will let us know if he develops any signs or symptoms of a gout flare between now and her next visit.  Vitamin D deficiency Check vitamin D.  Preventative Healthcare: Check labs.  Flu vaccine declined.  Up-to-date on Tdap.  Patient Counseling(The following topics were reviewed and/or handout was given):  -Nutrition: Stressed importance of moderation in sodium/caffeine intake, saturated fat and cholesterol, caloric balance, sufficient intake of fresh fruits, vegetables, and fiber.  -Stressed the importance of regular exercise.   -Substance Abuse: Discussed  cessation/primary prevention of tobacco, alcohol, or other drug use; driving or other dangerous activities under the influence; availability of treatment for abuse.   -Injury prevention: Discussed safety belts, safety helmets, smoke detector, smoking near bedding or upholstery.   -Sexuality: Discussed sexually transmitted diseases, partner selection, use of condoms, avoidance of unintended pregnancy and contraceptive alternatives.   -Dental health: Discussed importance of regular tooth brushing, flossing, and dental visits.  -Health maintenance and immunizations reviewed. Please refer to Health maintenance section.  Return to care in 1 year for next preventative visit.     Subjective:  HPI:  See A/P for status of chronic conditions.  Patient is here today for his annual physical.  He did get labs a few months ago which showed uric acid elevation.  He has not had any signs or symptoms of a gout flare.  He would like to have this level rechecked today.  He does occasionally get some irritation around his penis. Uses cortisone cream which helps. This has been going on for about 3 months. No precipitating events. No redness. No drainage or discharge.   He did have a hemorrhoid a few weeks ago. He was started on conservative therapy and symptoms have resolved.  He does occasionally get constipation.  Lifestyle Diet: Balanced. Plenty of fruits and vegetables.  Exercise: Going back into gym. Treadmill 5 times per week. 40 minutes.      03/09/2023    8:12 AM  Depression screen PHQ 2/9  Decreased Interest 0  Down, Depressed, Hopeless 0  PHQ - 2  Score 0    Health Maintenance Due  Topic Date Due   COVID-19 Vaccine (5 - 2024-25 season) 10/22/2022     ROS: Per HPI, otherwise a complete review of systems was negative.   PMH:  The following were reviewed and entered/updated in epic: Past Medical History:  Diagnosis Date   Hyperlipidemia    Internal hemorrhoid, bleeding    Vitamin D  deficiency    Patient Active Problem List   Diagnosis Date Noted   Elevated uric acid in blood 03/09/2023   Hemorrhoid 03/03/2022   Lumbar spondylosis 02/16/2020   Hyperglycemia 12/13/2018   Dyslipidemia 04/06/2017   Vitamin D deficiency 03/27/2016   NAFL (nonalcoholic fatty liver) 03/27/2016   Past Surgical History:  Procedure Laterality Date   NASAL SINUS SURGERY      Family History  Problem Relation Age of Onset   Diabetes Mother    Anal fissures Mother    Diabetes Father    Kidney failure Father    Anal fissures Father     Medications- reviewed and updated Current Outpatient Medications  Medication Sig Dispense Refill   hydrocortisone (ANUSOL-HC) 25 MG suppository Place 1 suppository (25 mg total) rectally 2 (two) times daily. 12 suppository 0   No current facility-administered medications for this visit.    Allergies-reviewed and updated Allergies  Allergen Reactions   Floraquin [Iodoquinol] Rash    Social History   Socioeconomic History   Marital status: Married    Spouse name: Not on file   Number of children: 2   Years of education: Not on file   Highest education level: Not on file  Occupational History   Occupation: Printmaker  Tobacco Use   Smoking status: Never   Smokeless tobacco: Never  Vaping Use   Vaping status: Never Used  Substance and Sexual Activity   Alcohol use: Yes    Comment: Social    Drug use: No   Sexual activity: Yes    Partners: Female  Other Topics Concern   Not on file  Social History Narrative   Not on file   Social Drivers of Health   Financial Resource Strain: Not on file  Food Insecurity: Not on file  Transportation Needs: Not on file  Physical Activity: Not on file  Stress: Not on file  Social Connections: Not on file        Objective:  Physical Exam: BP 100/64   Pulse 77   Temp 97.6 F (36.4 C) (Temporal)   Ht 5\' 9"  (1.753 m)   Wt 190 lb 3.2 oz (86.3 kg)   SpO2 98%   BMI 28.09 kg/m    Body mass index is 28.09 kg/m. Wt Readings from Last 3 Encounters:  03/09/23 190 lb 3.2 oz (86.3 kg)  02/05/23 195 lb (88.5 kg)  03/03/22 193 lb 6.4 oz (87.7 kg)   Gen: NAD, resting comfortably HEENT: TMs normal bilaterally. OP clear. No thyromegaly noted.  CV: RRR with no murmurs appreciated Pulm: NWOB, CTAB with no crackles, wheezes, or rhonchi GI: Normal bowel sounds present. Soft, Nontender, Nondistended. MSK: no edema, cyanosis, or clubbing noted GU: Normal male genitalia.  Uncircumcised.  No obvious rashes or lesions. Skin: warm, dry Neuro: CN2-12 grossly intact. Strength 5/5 in upper and lower extremities. Reflexes symmetric and intact bilaterally.  Psych: Normal affect and thought content     Jamilynn Whitacre M. Jimmey Ralph, MD 03/09/2023 8:46 AM

## 2023-03-09 NOTE — Assessment & Plan Note (Signed)
Check vitamin D. 

## 2023-03-09 NOTE — Patient Instructions (Signed)
It was very nice to see you today!  We will check blood work today.  Please use zinc oxide around the area at the tip of your penis.  Let us know if this is not improving in the next few weeks.  You hemorrhoids have resolved.  Please continue to eat a high-fiber diet.  We will check uric acid level.  Please see the below eating plan to help to reduce your uric acid level.  We do not need to start meds at this point however please let us know if you develop any painful, red, or inflamed joints.  Return in about 1 year (around 03/08/2024).   Take care, Dr Jimmey Ralph  PLEASE NOTE:  If you had any lab tests, please let us know if you have not heard back within a few days. You may see your results on mychart before we have a chance to review them but we will give you a call once they are reviewed by [place].   If we ordered any referrals today, please let us know if you have not heard from their office within the next week.   If you had any urgent prescriptions sent in today, please check with the pharmacy within an hour of our visit to make sure the prescription was transmitted appropriately.   Please try these tips to maintain a healthy lifestyle:  Eat at least 3 REAL meals and 1-2 snacks per day.  Aim for no more than 5 hours between eating.  If you eat breakfast, please do so within one hour of getting up.   Each meal should contain half fruits/vegetables, one quarter protein, and one quarter carbs (no bigger than a computer mouse)  Cut down on sweet beverages. This includes juice, soda, and sweet tea.   Drink at least 1 glass of water with each meal and aim for at least 8 glasses per day  Exercise at least 150 minutes every week.    Preventive Care 48-29 Years Old, Male Preventive care refers to lifestyle choices and visits with your health care provider that can promote health and wellness. Preventive care visits are also called wellness exams. What can I expect for my preventive care  visit? Counseling During your preventive care visit, your health care provider may ask about your: Medical history, including: Past medical problems. Family medical history. Current health, including: Emotional well-being. Home life and relationship well-being. Sexual activity. Lifestyle, including: Alcohol, nicotine or tobacco, and drug use. Access to firearms. Diet, exercise, and sleep habits. Safety issues such as seatbelt and bike helmet use. Sunscreen use. Work and work Astronomer. Physical exam Your health care provider will check your: Height and weight. These may be used to calculate your BMI (body mass index). BMI is a measurement that tells if you are at a healthy weight. Waist circumference. This measures the distance around your waistline. This measurement also tells if you are at a healthy weight and may help predict your risk of certain diseases, such as type 2 diabetes and high blood pressure. Heart rate and blood pressure. Body temperature. Skin for abnormal spots. What immunizations do I need?  Vaccines are usually given at various ages, according to a schedule. Your health care provider will recommend vaccines for you based on your age, medical history, and lifestyle or other factors, such as travel or where you work. What tests do I need? Screening Your health care provider may recommend screening tests for certain conditions. This may include: Lipid and cholesterol levels. Diabetes  screening. This is done by checking your blood sugar (glucose) after you have not eaten for a while (fasting). Hepatitis B test. Hepatitis C test. HIV (human immunodeficiency virus) test. STI (sexually transmitted infection) testing, if you are at risk. Lung cancer screening. Prostate cancer screening. Colorectal cancer screening. Talk with your health care provider about your test results, treatment options, and if necessary, the need for more tests. Follow these instructions  at home: Eating and drinking  Eat a diet that includes fresh fruits and vegetables, whole grains, lean protein, and low-fat dairy products. Take vitamin and mineral supplements as recommended by your health care provider. Do not drink alcohol if your health care provider tells you not to drink. If you drink alcohol: Limit how much you have to 0-2 drinks a day. Know how much alcohol is in your drink. In the [place] one drink equals one 12 oz bottle of beer (355 mL), one 5 oz glass of wine (148 mL), or one 1 oz glass of hard liquor (44 mL). Lifestyle Brush your teeth every morning and night with fluoride toothpaste. Floss one time each day. Exercise for at least 30 minutes 5 or more days each week. Do not use any products that contain nicotine or tobacco. These products include cigarettes, chewing tobacco, and vaping devices, such as e-cigarettes. If you need help quitting, ask your health care provider. Do not use drugs. If you are sexually active, practice safe sex. Use a condom or other form of protection to prevent STIs. Take aspirin only as told by your health care provider. Make sure that you understand how much to take and what form to take. Work with your health care provider to find out whether it is safe and beneficial for you to take aspirin daily. Find healthy ways to manage stress, such as: Meditation, yoga, or listening to music. Journaling. Talking to a trusted person. Spending time with friends and family. Minimize exposure to UV radiation to reduce your risk of skin cancer. Safety Always wear your seat belt while driving or riding in a vehicle. Do not drive: If you have been drinking alcohol. Do not ride with someone who has been drinking. When you are tired or distracted. While texting. If you have been using any mind-altering substances or drugs. Wear a helmet and other protective equipment during sports activities. If you have firearms in your house, make sure you  follow all gun safety procedures. What's next? Go to your health care provider once a year for an annual wellness visit. Ask your health care provider how often you should have your eyes and teeth checked. Stay up to date on all vaccines. This information is not intended to replace advice given to you by your health care provider. Make sure you discuss any questions you have with your health care provider. Document Revised: 08/04/2020 Document Reviewed: 08/04/2020 Elsevier Patient Education  2024 [employer]   Low-Purine Eating Plan A low-purine eating plan involves making food choices to limit your purine intake. Purine is a kind of uric acid. Too much uric acid in your blood can cause certain conditions, such as gout and kidney stones. Eating a low-purine diet may help control these conditions. What are tips for following this plan? Shopping Avoid buying products that contain high-fructose corn syrup. Check for this on food labels. It is commonly found in many processed foods and soft drinks. Be sure to check for it in baked goods such as cookies, canned fruits, and cereals and cereal bars.  Avoid buying veal, chicken breast with skin, lamb, and organ meats such as liver. These types of meats tend to have the highest purine content. Choose dairy products. These may lower uric acid levels. Avoid certain types of fish. Not all fish and seafood have high purine content. Examples with high purine content include anchovies, trout, tuna, sardines, and salmon. Avoid buying beverages that contain alcohol, particularly beer and hard liquor. Alcohol can affect the way your body gets rid of uric acid. Meal planning  Learn which foods do or do not affect you. If you find out that a food tends to cause your gout symptoms to flare up, avoid eating that food. You can enjoy foods that do not cause problems. If you have any questions about a food item, talk with your dietitian or health care  provider. Reduce the overall amount of meat in your diet. When you do eat meat, choose ones with lower purine content. Include plenty of fruits and vegetables. Although some vegetables may have a high purine content--such as asparagus, mushrooms, spinach, or cauliflower--it has been shown that these do not contribute to uric acid blood levels as much. Consume at least 1 dairy serving a day. This has been shown to decrease uric acid levels. General information If you drink alcohol: Limit how much you have to: 0-1 drink a day for women who are not pregnant. 0-2 drinks a day for men. Know how much alcohol is in a drink. In the [place] one drink equals one 12 oz bottle of beer (355 mL), one 5 oz glass of wine (148 mL), or one 1 oz glass of hard liquor (44 mL). Drink plenty of water. Try to drink enough to keep your urine pale yellow. Fluids can help remove uric acid from your body. Work with your health care provider and dietitian to develop a plan to achieve or maintain a healthy weight. Losing weight may help reduce uric acid in your blood. What foods are recommended? The following are some types of foods that are good choices when limiting purine intake: Fresh or frozen fruits and vegetables. Whole grains, breads, cereals, and pasta. Rice. Beans, peas, legumes. Nuts and seeds. Dairy products. Fats and oils. The items listed above may not be a complete list. Talk with a dietitian about what dietary choices are best for you. What foods are not recommended? Limit your intake of foods high in purines, including: Beer and other alcohol. Meat-based gravy or sauce. Canned or fresh fish, such as: Anchovies, sardines, herring, salmon, and tuna. Mussels and scallops. Codfish, trout, and haddock. Bacon, veal, chicken breast with skin, and lamb. Organ meats, such as: Liver or kidney. Tripe. Sweetbreads (thymus gland or pancreas). Wild Education officer, environmental. Yeast or yeast extract supplements. Drinks  sweetened with high-fructose corn syrup, such as soda. Processed foods made with high-fructose corn syrup. The items listed above may not be a complete list of foods and beverages you should limit. Contact a dietitian for more information. Summary Eating a low-purine diet may help control conditions caused by too much uric acid in the body, such as gout or kidney stones. Choose low-purine foods, limit alcohol, and limit high-fructose corn syrup. You will learn over time which foods do or do not affect you. If you find out that a food tends to cause your gout symptoms to flare up, avoid eating that food. This information is not intended to replace advice given to you by your health care provider. Make sure you discuss any questions  you have with your health care provider. Document Revised: 01/20/2021 Document Reviewed: 01/20/2021 Elsevier Patient Education  2024 [employer].

## 2023-03-09 NOTE — Assessment & Plan Note (Signed)
Check lipids 

## 2023-03-09 NOTE — Assessment & Plan Note (Signed)
Did have flipper few weeks ago but symptoms have resolved.  No obvious hemorrhoids on exam today.  He can continue hydrocortisone cream as needed.  We discussed importance of soft and regular bowel movements 1-2 times daily and avoiding prolonged sitting having the toilet.  We will refer to GI if this continues to be an issue.

## 2023-03-09 NOTE — Assessment & Plan Note (Signed)
Check uric acid level.  He has not had any signs or symptoms of acute gout flare-do not think that we need to start any uric acid lowering medications at this point.  He is agreeable to this plan.  We did discuss low purine diet and dietary strategies to reduce uric acid level.  He will let us know if he develops any signs or symptoms of a gout flare between now and her next visit.

## 2023-03-09 NOTE — Assessment & Plan Note (Signed)
Check A1c. 

## 2023-03-12 ENCOUNTER — Encounter: Payer: Self-pay | Admitting: Family Medicine

## 2023-03-13 ENCOUNTER — Encounter: Payer: Self-pay | Admitting: Family Medicine

## 2023-03-13 NOTE — Progress Notes (Signed)
Cholesterol is elevated but stable compared to his last previous values.  His blood sugar is also borderline elevated but stable.  Rest of his labs are all at goal.  Do not need to start meds for cholesterol or blood sugar however he needs to work on diet and exercise and we can recheck both of these in a year.  His uric acid level was 7.6.  This is within normal range.  He should let us know if any signs of gout develop such as joint pain, redness, or swelling.

## 2023-06-29 ENCOUNTER — Other Ambulatory Visit: Payer: Self-pay | Admitting: Family Medicine

## 2023-06-29 DIAGNOSIS — K6289 Other specified diseases of anus and rectum: Secondary | ICD-10-CM

## 2023-06-29 MED ORDER — HYDROCORTISONE ACETATE 25 MG RE SUPP: 25.0000 mg | Freq: Two times a day (BID) | RECTAL | 0 refills | Status: DC

## 2023-06-29 NOTE — Telephone Encounter (Signed)
 Ok to refill but he needs to be referred to gastroenterology if this still an ongoing issue.  Jinny Mounts. Daneil Dunker, MD 06/29/2023 12:57 PM

## 2024-03-11 ENCOUNTER — Encounter: Payer: Self-pay | Admitting: Family Medicine

## 2024-03-11 ENCOUNTER — Ambulatory Visit: Payer: Managed Care, Other (non HMO) | Admitting: Family Medicine

## 2024-03-11 VITALS — BP 110/60 | HR 70 | Temp 97.5°F | Ht 69.0 in | Wt 191.0 lb

## 2024-03-11 DIAGNOSIS — E790 Hyperuricemia without signs of inflammatory arthritis and tophaceous disease: Secondary | ICD-10-CM

## 2024-03-11 DIAGNOSIS — E785 Hyperlipidemia, unspecified: Secondary | ICD-10-CM | POA: Diagnosis not present

## 2024-03-11 DIAGNOSIS — K6289 Other specified diseases of anus and rectum: Secondary | ICD-10-CM

## 2024-03-11 DIAGNOSIS — Z23 Encounter for immunization: Secondary | ICD-10-CM | POA: Diagnosis not present

## 2024-03-11 DIAGNOSIS — E559 Vitamin D deficiency, unspecified: Secondary | ICD-10-CM

## 2024-03-11 DIAGNOSIS — Z0001 Encounter for general adult medical examination with abnormal findings: Secondary | ICD-10-CM

## 2024-03-11 DIAGNOSIS — Z0000 Encounter for general adult medical examination without abnormal findings: Secondary | ICD-10-CM | POA: Diagnosis not present

## 2024-03-11 DIAGNOSIS — R739 Hyperglycemia, unspecified: Secondary | ICD-10-CM | POA: Diagnosis not present

## 2024-03-11 LAB — LIPID PANEL
Cholesterol: 224 mg/dL — ABNORMAL HIGH (ref 28–200)
HDL: 35.2 mg/dL — ABNORMAL LOW
LDL Cholesterol: 153 mg/dL — ABNORMAL HIGH (ref 10–99)
NonHDL: 188.88
Total CHOL/HDL Ratio: 6
Triglycerides: 180 mg/dL — ABNORMAL HIGH (ref 10.0–149.0)
VLDL: 36 mg/dL (ref 0.0–40.0)

## 2024-03-11 LAB — URINALYSIS, ROUTINE W REFLEX MICROSCOPIC
Bilirubin Urine: NEGATIVE
Hgb urine dipstick: NEGATIVE
Ketones, ur: NEGATIVE
Leukocytes,Ua: NEGATIVE
Nitrite: NEGATIVE
RBC / HPF: NONE SEEN
Specific Gravity, Urine: 1.02 (ref 1.000–1.030)
Total Protein, Urine: NEGATIVE
Urine Glucose: NEGATIVE
Urobilinogen, UA: 0.2 (ref 0.0–1.0)
pH: 6 (ref 5.0–8.0)

## 2024-03-11 LAB — COMPREHENSIVE METABOLIC PANEL WITH GFR
ALT: 25 U/L (ref 3–53)
AST: 18 U/L (ref 5–37)
Albumin: 4.8 g/dL (ref 3.5–5.2)
Alkaline Phosphatase: 49 U/L (ref 39–117)
BUN: 12 mg/dL (ref 6–23)
CO2: 28 meq/L (ref 19–32)
Calcium: 9.4 mg/dL (ref 8.4–10.5)
Chloride: 104 meq/L (ref 96–112)
Creatinine, Ser: 0.91 mg/dL (ref 0.40–1.50)
GFR: 99.47 mL/min
Glucose, Bld: 99 mg/dL (ref 70–99)
Potassium: 4.3 meq/L (ref 3.5–5.1)
Sodium: 138 meq/L (ref 135–145)
Total Bilirubin: 0.6 mg/dL (ref 0.2–1.2)
Total Protein: 7.6 g/dL (ref 6.0–8.3)

## 2024-03-11 LAB — TSH: TSH: 1.69 u[IU]/mL (ref 0.35–5.50)

## 2024-03-11 LAB — CBC
HCT: 43.9 % (ref 39.0–52.0)
Hemoglobin: 15.1 g/dL (ref 13.0–17.0)
MCHC: 34.4 g/dL (ref 30.0–36.0)
MCV: 88.1 fl (ref 78.0–100.0)
Platelets: 218 K/uL (ref 150.0–400.0)
RBC: 4.99 Mil/uL (ref 4.22–5.81)
RDW: 13.1 % (ref 11.5–15.5)
WBC: 4.5 K/uL (ref 4.0–10.5)

## 2024-03-11 LAB — VITAMIN B12: Vitamin B-12: 412 pg/mL (ref 211–911)

## 2024-03-11 LAB — PSA: PSA: 1.34 ng/mL (ref 0.10–4.00)

## 2024-03-11 LAB — URIC ACID: Uric Acid, Serum: 7.4 mg/dL (ref 4.0–7.8)

## 2024-03-11 LAB — VITAMIN D 25 HYDROXY (VIT D DEFICIENCY, FRACTURES): VITD: 19.72 ng/mL — ABNORMAL LOW (ref 30.00–100.00)

## 2024-03-11 LAB — HEMOGLOBIN A1C: Hgb A1c MFr Bld: 5.3 % (ref 4.6–6.5)

## 2024-03-11 MED ORDER — MUPIROCIN 2 % EX OINT: 1.0000 | TOPICAL_OINTMENT | Freq: Two times a day (BID) | CUTANEOUS | 0 refills | Status: AC

## 2024-03-11 MED ORDER — AZELASTINE HCL 0.1 % NA SOLN: 2.0000 | Freq: Two times a day (BID) | NASAL | 12 refills | Status: AC

## 2024-03-11 NOTE — Assessment & Plan Note (Signed)
 Check A1c.

## 2024-03-11 NOTE — Assessment & Plan Note (Signed)
 Check uric acid.

## 2024-03-11 NOTE — Assessment & Plan Note (Signed)
Check vitamin D with labs.

## 2024-03-11 NOTE — Patient Instructions (Signed)
 It was very nice to see you today!  VISIT SUMMARY: Today, you were seen for nasal congestion and a recurring nasal scar. We discussed your symptoms and prescribed medications to help with your nasal issues. We also addressed concerns about your glucose levels, cholesterol, vitamin D , and uric acid, and ordered the necessary blood tests. You received your flu shot today.  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 Alcohol makes your symptoms worse. -Apply mupirocin  ointment inside your nostrils twice daily for one week. -Use Astelin  nasal spray as directed for congestion relief. -Follow up in one week if symptoms persist for a potential ENT referral.  IMMUNIZATION DUE: You were due for a flu shot. -You received your flu shot today.  HYPERGLYCEMIA: There are concerns about your glucose levels and increased urination, possibly due to fruit intake. -We ordered blood work including A1c and glucose levels, and a urine sample for analysis.  DYSLIPIDEMIA: Routine cholesterol monitoring is required. -We ordered blood work including cholesterol levels.  VITAMIN D  DEFICIENCY: Routine vitamin D  monitoring is necessary. -We ordered blood work including vitamin D  levels.  HYPERURICEMIA: Routine uric acid monitoring is needed. -We ordered blood work including uric acid levels.  Return in about 1 year (around 03/11/2025) for Annual Physical.   Take care, Dr Kennyth  PLEASE NOTE:  If you had any lab tests, please let us  know if you have not heard back within a few days. You may see your results on mychart before we have a chance to review them but we will give you a call once they are reviewed by us .   If we ordered any referrals today, please let us  know if you have not heard from their office within the next week.   If you had any urgent prescriptions sent  in today, please check with the pharmacy within an hour of our visit to make sure the prescription was transmitted appropriately.   Please try these tips to maintain a healthy lifestyle:  Eat at least 3 REAL meals and 1-2 snacks per day.  Aim for no more than 5 hours between eating.  If you eat breakfast, please do so within one hour of getting up.   Each meal should contain half fruits/vegetables, one quarter protein, and one quarter carbs (no bigger than a computer mouse)  Cut down on sweet beverages. This includes juice, soda, and sweet tea.   Drink at least 1 glass of water with each meal and aim for at least 8 glasses per day  Exercise at least 150 minutes every week.     Preventive Care 62-33 Years Old, Male Preventive care refers to lifestyle choices and visits with your health care provider that can promote health and wellness. Preventive care visits are also called wellness exams. What can I expect for my preventive care visit? Counseling During your preventive care visit, your health care provider may ask about your: Medical history, including: Past medical problems. Family medical history. Current health, including: Emotional well-being. Home life and relationship well-being. Sexual activity. Lifestyle, including: Alcohol, nicotine or tobacco, and drug use. Access to firearms. Diet, exercise, and sleep habits. Safety issues such as seatbelt and bike helmet use. Sunscreen use. Work and work astronomer. Physical exam Your health care provider will check your: Height and weight. These may be used to calculate your BMI (body mass index). BMI is a  measurement that tells if you are at a healthy weight. Waist circumference. This measures the distance around your waistline. This measurement also tells if you are at a healthy weight and may help predict your risk of certain diseases, such as type 2 diabetes and high blood pressure. Heart rate and blood pressure. Body  temperature. Skin for abnormal spots. What immunizations do I need?  Vaccines are usually given at various ages, according to a schedule. Your health care provider will recommend vaccines for you based on your age, medical history, and lifestyle or other factors, such as travel or where you work. What tests do I need? Screening Your health care provider may recommend screening tests for certain conditions. This may include: Lipid and cholesterol levels. Diabetes screening. This is done by checking your blood sugar (glucose) after you have not eaten for a while (fasting). Hepatitis B test. Hepatitis C test. HIV (human immunodeficiency virus) test. STI (sexually transmitted infection) testing, if you are at risk. Lung cancer screening. Prostate cancer screening. Colorectal cancer screening. Talk with your health care provider about your test results, treatment options, and if necessary, the need for more tests. Follow these instructions at home: Eating and drinking  Eat a diet that includes fresh fruits and vegetables, whole grains, lean protein, and low-fat dairy products. Take vitamin and mineral supplements as recommended by your health care provider. Do not drink alcohol if your health care provider tells you not to drink. If you drink alcohol: Limit how much you have to 0-2 drinks a day. Know how much alcohol is in your drink. In the [place] one drink equals one 12 oz bottle of beer (355 mL), one 5 oz glass of wine (148 mL), or one 1 oz glass of hard liquor (44 mL). Lifestyle Brush your teeth every morning and night with fluoride toothpaste. Floss one time each day. Exercise for at least 30 minutes 5 or more days each week. Do not use any products that contain nicotine or tobacco. These products include cigarettes, chewing tobacco, and vaping devices, such as e-cigarettes. If you need help quitting, ask your health care provider. Do not use drugs. If you are sexually active,  practice safe sex. Use a condom or other form of protection to prevent STIs. Take aspirin only as told by your health care provider. Make sure that you understand how much to take and what form to take. Work with your health care provider to find out whether it is safe and beneficial for you to take aspirin daily. Find healthy ways to manage stress, such as: Meditation, yoga, or listening to music. Journaling. Talking to a trusted person. Spending time with friends and family. Minimize exposure to UV radiation to reduce your risk of skin cancer. Safety Always wear your seat belt while driving or riding in a vehicle. Do not drive: If you have been drinking alcohol. Do not ride with someone who has been drinking. When you are tired or distracted. While texting. If you have been using any mind-altering substances or drugs. Wear a helmet and other protective equipment during sports activities. If you have firearms in your house, make sure you follow all gun safety procedures. What's next? Go to your health care provider once a year for an annual wellness visit. Ask your health care provider how often you should have your eyes and teeth checked. Stay up to date on all vaccines. This information is not intended to replace advice given to you by your health care provider. Make  sure you discuss any questions you have with your health care provider. Document Revised: 08/04/2020 Document Reviewed: 08/04/2020 Elsevier Patient Education  2024 [employer].

## 2024-03-11 NOTE — Progress Notes (Signed)
 "  Chief Complaint:  Add [name] is a 49 y.o. male who presents today for his annual comprehensive physical exam.    Assessment/Plan:  New/Acute Problems: Vestibulitis Patient with inflamed left nostril consistent with this to Bilitis.  Will try topical mupirocin .  Will also send prescription for Astelin  for sinus congestion.  He will let us  know if not proving in the next week or so and would consider referral to ENT at that time.  Lipoma No red flags.  Reassured patient.  Discussed reasons to return to care.  Will continue to watch waiting.  Chronic Problems Addressed Today: Vitamin D  deficiency Check vitamin D  with labs.   Dyslipidemia Check lipids.  Discussed lifestyle modifications.  Hyperglycemia Check A1c.  Elevated uric acid in blood Check uric acid.   Preventative Healthcare: Flu shot given today. Check labs.  Up-to-date on colon cancer screening.  Patient Counseling(The following topics were reviewed and/or handout was given):  -Nutrition: Stressed importance of moderation in sodium/caffeine intake, saturated fat and cholesterol, caloric balance, sufficient intake of fresh fruits, vegetables, and fiber.  -Stressed the importance of regular exercise.   -Substance Abuse: Discussed cessation/primary prevention of tobacco, alcohol, or other drug use; driving or other dangerous activities under the influence; availability of treatment for abuse.   -Injury prevention: Discussed safety belts, safety helmets, smoke detector, smoking near bedding or upholstery.   -Sexuality: Discussed sexually transmitted diseases, partner selection, use of condoms, avoidance of unintended pregnancy and contraceptive alternatives.   -Dental health: Discussed importance of regular tooth brushing, flossing, and dental visits.  -Health maintenance and immunizations reviewed. Please refer to Health maintenance section.  Return to care in 1 year for next preventative visit.     Subjective:   HPI:  Patient is here today for his annual physical.  See assessment / plan for status of chronic conditions.  Discussed the use of AI scribe software for clinical note transcription with the patient, who gave verbal consent to proceed.  History of Present Illness Thomas Cooley is a 49 year old male who presents with nasal congestion and a recurring nasal scar.  For the past two months, he has experienced severe nasal congestion primarily in the left nostril. He uses DayQuil and NyQuil for relief, but symptoms recur. He has not tried other medications except for using hot vapors for self-cleaning, which provides minimal relief. Alcohol consumption, particularly more than two shots of whiskey or two glasses of margarita, exacerbates his nasal congestion, making it difficult to sleep.  He describes a recurring scar in the left nostril that appears every three months, becomes red, and is irritating. He associates this with a past reaction to amoxicillin, although he has not taken it recently. The scar is located at the bottom corner of the left nostril in an L shape.  He reports increased urination after consuming fruits, which he eats when feeling hungry between his two daily meals at 12 PM and 6 PM. No burning or discoloration during urination, and increased urination occurs only occasionally.  He exercises three to four times a week, primarily doing cardio and stretching exercises. He finds it challenging to maintain physical activity during winter. He consumes alcohol occasionally.   He has noticed a lump in his right forearm for the last year or so.  He has not had any significant change in size or symptoms over that time.  Father has a similar lesion in his form as well.  No pain.  No obvious precipitating causes.  03/11/2024    8:10 AM  Depression screen PHQ 2/9  Decreased Interest 0  Down, Depressed, Hopeless 0  PHQ - 2 Score 0    Health Maintenance Due  Topic Date Due    Influenza Vaccine  09/21/2023     ROS: Per HPI, otherwise a complete review of systems was negative.   PMH:  The following were reviewed and entered/updated in epic: Past Medical History:  Diagnosis Date   Hyperlipidemia    Internal hemorrhoid, bleeding    Vitamin D  deficiency    Patient Active Problem List   Diagnosis Date Noted   Elevated uric acid in blood 03/09/2023   Hemorrhoid 03/03/2022   Lumbar spondylosis 02/16/2020   Hyperglycemia 12/13/2018   Dyslipidemia 04/06/2017   Vitamin D  deficiency 03/27/2016   NAFL (nonalcoholic fatty liver) 03/27/2016   Past Surgical History:  Procedure Laterality Date   NASAL SINUS SURGERY      Family History  Problem Relation Age of Onset   Diabetes Mother    Anal fissures Mother    Diabetes Father    Kidney failure Father    Anal fissures Father     Medications- reviewed and updated Current Outpatient Medications  Medication Sig Dispense Refill   azelastine  (ASTELIN ) 0.1 % nasal spray Place 2 sprays into both nostrils 2 (two) times daily. 30 mL 12   mupirocin  ointment (BACTROBAN ) 2 % Place 1 Application into the nose 2 (two) times daily. 22 g 0   No current facility-administered medications for this visit.    Allergies-reviewed and updated Allergies[1]  Social History   Socioeconomic History   Marital status: Married    Spouse name: Not on file   Number of children: 2   Years of education: Not on file   Highest education level: Not on file  Occupational History   Occupation: Printmaker  Tobacco Use   Smoking status: Never   Smokeless tobacco: Never  Vaping Use   Vaping status: Never Used  Substance and Sexual Activity   Alcohol use: Yes    Comment: Social    Drug use: No   Sexual activity: Yes    Partners: Female  Other Topics Concern   Not on file  Social History Narrative   Not on file   Social Drivers of Health   Tobacco Use: Low Risk (03/11/2024)   Patient History    Smoking Tobacco Use:  Never    Smokeless Tobacco Use: Never    Passive Exposure: Not on file  Financial Resource Strain: Not on file  Food Insecurity: Not on file  Transportation Needs: Not on file  Physical Activity: Not on file  Stress: Not on file  Social Connections: Not on file  Depression (PHQ2-9): Low Risk (03/11/2024)   Depression (PHQ2-9)    PHQ-2 Score: 0  Alcohol Screen: Not on file  Housing: Not on file  Utilities: Not on file  Health Literacy: Not on file        Objective:  Physical Exam: BP 110/60   Pulse 70   Temp (!) 97.5 F (36.4 C) (Temporal)   Ht 5' 9 (1.753 m)   Wt 191 lb (86.6 kg)   SpO2 99%   BMI 28.21 kg/m   Body mass index is 28.21 kg/m. Wt Readings from Last 3 Encounters:  03/11/24 191 lb (86.6 kg)  03/09/23 190 lb 3.2 oz (86.3 kg)  02/05/23 195 lb (88.5 kg)   Gen: NAD, resting comfortably HEENT: TMs normal bilaterally. OP clear. No thyromegaly  noted.  CV: RRR with no murmurs appreciated Pulm: NWOB, CTAB with no crackles, wheezes, or rhonchi GI: Normal bowel sounds present. Soft, Nontender, Nondistended. MSK: no edema, cyanosis, or clubbing noted.  Subtle freely mobile approximately 1 cm lump noted in right forearm consistent with lipoma. Skin: warm, dry Neuro: CN2-12 grossly intact. Strength 5/5 in upper and lower extremities. Reflexes symmetric and intact bilaterally.  Psych: Normal affect and thought content     Lashaundra Lehrmann M. Kennyth, MD 03/11/2024 8:31 AM     [1]  Allergies Allergen Reactions   Floraquin [Iodoquinol] Rash   "

## 2024-03-11 NOTE — Assessment & Plan Note (Signed)
 Check lipids. Discussed lifestyle modifications.

## 2024-03-14 ENCOUNTER — Ambulatory Visit: Payer: Self-pay | Admitting: Family Medicine

## 2024-03-14 DIAGNOSIS — E785 Hyperlipidemia, unspecified: Secondary | ICD-10-CM

## 2024-03-14 MED ORDER — VITAMIN D (ERGOCALCIFEROL) 1.25 MG (50000 UNIT) PO CAPS: 50000.0000 [IU] | ORAL_CAPSULE | ORAL | 0 refills | Status: AC

## 2024-03-14 NOTE — Progress Notes (Signed)
 Cholesterol is elevated.  Do not need to start medications for this at this time however he should continue to work on diet and exercise and we can recheck in a year or so.  His vitamin D  is low.  Recommend 50,000 IUs weekly for 3 months to be followed by 5000 IUs daily.  Please send a prescription for 50,000 IUs weekly.  We should recheck again in 3 to 6 months.

## 2024-03-20 NOTE — Telephone Encounter (Signed)
 Patient notified of Dr. Carroll message [facility] MyChart.

## 2024-03-20 NOTE — Telephone Encounter (Signed)
 HE can use it as needed.

## 2024-03-20 NOTE — Telephone Encounter (Signed)
 Please advise.

## 2025-03-13 ENCOUNTER — Encounter: Admitting: Family Medicine
